# Patient Record
Sex: Female | Born: 1935 | Race: White | Hispanic: No | Smoking: Never smoker
Health system: Southern US, Community
[De-identification: ages and names within clinical notes are randomized; demographics above are authoritative.]

## PROBLEM LIST (undated history)

## (undated) DIAGNOSIS — R251 Tremor, unspecified: Secondary | ICD-10-CM

## (undated) DIAGNOSIS — F0390 Unspecified dementia without behavioral disturbance: Secondary | ICD-10-CM

## (undated) DIAGNOSIS — E079 Disorder of thyroid, unspecified: Secondary | ICD-10-CM

## (undated) HISTORY — DX: Tremor, unspecified: R25.1

## (undated) HISTORY — DX: Unspecified dementia, unspecified severity, without behavioral disturbance, psychotic disturbance, mood disturbance, and anxiety: F03.90

## (undated) HISTORY — DX: Disorder of thyroid, unspecified: E07.9

---

## 1958-06-18 HISTORY — PX: THYROID SURGERY: SHX805

## 2000-08-07 ENCOUNTER — Emergency Department (HOSPITAL_COMMUNITY): Admission: EM | Admit: 2000-08-07 | Discharge: 2000-08-07 | Payer: Self-pay | Admitting: Emergency Medicine

## 2010-11-11 ENCOUNTER — Emergency Department (HOSPITAL_COMMUNITY): Payer: Medicare Other

## 2010-11-11 ENCOUNTER — Inpatient Hospital Stay (HOSPITAL_COMMUNITY): Payer: Medicare Other

## 2010-11-11 ENCOUNTER — Inpatient Hospital Stay (HOSPITAL_COMMUNITY)
Admission: EM | Admit: 2010-11-11 | Discharge: 2010-11-21 | DRG: 808 | Disposition: A | Discharge status: Rehabilitation Facility | Payer: Medicare Other | Attending: Family Medicine | Admitting: Family Medicine

## 2010-11-11 DIAGNOSIS — Z79899 Other long term (current) drug therapy: Secondary | ICD-10-CM

## 2010-11-11 DIAGNOSIS — Z23 Encounter for immunization: Secondary | ICD-10-CM

## 2010-11-11 DIAGNOSIS — D72819 Decreased white blood cell count, unspecified: Secondary | ICD-10-CM

## 2010-11-11 DIAGNOSIS — F341 Dysthymic disorder: Secondary | ICD-10-CM | POA: Diagnosis present

## 2010-11-11 DIAGNOSIS — R6520 Severe sepsis without septic shock: Secondary | ICD-10-CM | POA: Diagnosis not present

## 2010-11-11 DIAGNOSIS — R197 Diarrhea, unspecified: Secondary | ICD-10-CM | POA: Diagnosis present

## 2010-11-11 DIAGNOSIS — E0590 Thyrotoxicosis, unspecified without thyrotoxic crisis or storm: Secondary | ICD-10-CM | POA: Diagnosis present

## 2010-11-11 DIAGNOSIS — Z7982 Long term (current) use of aspirin: Secondary | ICD-10-CM

## 2010-11-11 DIAGNOSIS — D61811 Other drug-induced pancytopenia: Principal | ICD-10-CM | POA: Diagnosis present

## 2010-11-11 DIAGNOSIS — R5381 Other malaise: Secondary | ICD-10-CM | POA: Diagnosis not present

## 2010-11-11 DIAGNOSIS — E43 Unspecified severe protein-calorie malnutrition: Secondary | ICD-10-CM | POA: Diagnosis not present

## 2010-11-11 DIAGNOSIS — D696 Thrombocytopenia, unspecified: Secondary | ICD-10-CM

## 2010-11-11 DIAGNOSIS — E876 Hypokalemia: Secondary | ICD-10-CM | POA: Diagnosis present

## 2010-11-11 DIAGNOSIS — E860 Dehydration: Secondary | ICD-10-CM | POA: Diagnosis present

## 2010-11-11 DIAGNOSIS — F0390 Unspecified dementia without behavioral disturbance: Secondary | ICD-10-CM | POA: Diagnosis present

## 2010-11-11 DIAGNOSIS — N179 Acute kidney failure, unspecified: Secondary | ICD-10-CM | POA: Diagnosis present

## 2010-11-11 DIAGNOSIS — R6521 Severe sepsis with septic shock: Secondary | ICD-10-CM | POA: Diagnosis not present

## 2010-11-11 DIAGNOSIS — E538 Deficiency of other specified B group vitamins: Secondary | ICD-10-CM | POA: Diagnosis present

## 2010-11-11 DIAGNOSIS — T382X5A Adverse effect of antithyroid drugs, initial encounter: Secondary | ICD-10-CM | POA: Diagnosis present

## 2010-11-11 DIAGNOSIS — I10 Essential (primary) hypertension: Secondary | ICD-10-CM | POA: Diagnosis present

## 2010-11-11 DIAGNOSIS — A419 Sepsis, unspecified organism: Secondary | ICD-10-CM | POA: Diagnosis not present

## 2010-11-11 LAB — DIFFERENTIAL
Basophils Absolute: 0 10*3/uL (ref 0.0–0.1)
Basophils Relative: 1 % (ref 0–1)
Lymphocytes Relative: 30 % (ref 12–46)
Monocytes Absolute: 0.1 10*3/uL (ref 0.1–1.0)
Monocytes Relative: 9 % (ref 3–12)
Neutro Abs: 0.8 10*3/uL — ABNORMAL LOW (ref 1.7–7.7)
Neutrophils Relative %: 60 % (ref 43–77)

## 2010-11-11 LAB — POCT I-STAT, CHEM 8
BUN: 44 mg/dL — ABNORMAL HIGH (ref 6–23)
Creatinine, Ser: 2.4 mg/dL — ABNORMAL HIGH (ref 0.4–1.2)
Potassium: 3.2 mEq/L — ABNORMAL LOW (ref 3.5–5.1)
Sodium: 131 mEq/L — ABNORMAL LOW (ref 135–145)
TCO2: 11 mmol/L (ref 0–100)

## 2010-11-11 LAB — URINALYSIS, ROUTINE W REFLEX MICROSCOPIC
Glucose, UA: NEGATIVE mg/dL
Leukocytes, UA: NEGATIVE
pH: 5.5 (ref 5.0–8.0)

## 2010-11-11 LAB — COMPREHENSIVE METABOLIC PANEL
ALT: 59 U/L — ABNORMAL HIGH (ref 0–35)
AST: 150 U/L — ABNORMAL HIGH (ref 0–37)
Alkaline Phosphatase: 52 U/L (ref 39–117)
CO2: 21 mEq/L (ref 19–32)
Calcium: 7.3 mg/dL — ABNORMAL LOW (ref 8.4–10.5)
Chloride: 99 mEq/L (ref 96–112)
GFR calc Af Amer: 57 mL/min — ABNORMAL LOW (ref >60)
GFR calc non Af Amer: 47 mL/min — ABNORMAL LOW (ref >60)
Potassium: 2.6 mEq/L — CL (ref 3.5–5.1)
Sodium: 135 mEq/L (ref 135–145)
Total Bilirubin: 0.6 mg/dL (ref 0.3–1.2)

## 2010-11-11 LAB — URINE MICROSCOPIC-ADD ON

## 2010-11-11 LAB — TECHNOLOGIST SMEAR REVIEW: Path Review: DECREASED

## 2010-11-11 LAB — CK TOTAL AND CKMB (NOT AT ARMC)
CK, MB: 1.1 ng/mL (ref 0.3–4.0)
Total CK: 721 U/L — ABNORMAL HIGH (ref 7–177)

## 2010-11-11 LAB — CBC
HCT: 37.4 % (ref 36.0–46.0)
Hemoglobin: 13.2 g/dL (ref 12.0–15.0)
RBC: 4.99 MIL/uL (ref 3.87–5.11)

## 2010-11-11 LAB — APTT: aPTT: 34 seconds (ref 24–37)

## 2010-11-12 ENCOUNTER — Inpatient Hospital Stay (HOSPITAL_COMMUNITY): Payer: Medicare Other

## 2010-11-12 DIAGNOSIS — D709 Neutropenia, unspecified: Secondary | ICD-10-CM

## 2010-11-12 DIAGNOSIS — D61818 Other pancytopenia: Secondary | ICD-10-CM

## 2010-11-12 LAB — DIFFERENTIAL
Basophils Relative: 0 % (ref 0–1)
Eosinophils Relative: 0 % (ref 0–5)
Lymphocytes Relative: 33 % (ref 12–46)
Lymphocytes Relative: 25 % (ref 12–46)
Lymphs Abs: 0.5 10*3/uL — ABNORMAL LOW (ref 0.7–4.0)
Lymphs Abs: 0.4 10*3/uL — ABNORMAL LOW (ref 0.7–4.0)
Monocytes Absolute: 0.1 10*3/uL (ref 0.1–1.0)
Monocytes Absolute: 0 10*3/uL — ABNORMAL LOW (ref 0.1–1.0)
Monocytes Relative: 4 % (ref 3–12)
Monocytes Relative: 2 % — ABNORMAL LOW (ref 3–12)
Neutro Abs: 1.2 10*3/uL — ABNORMAL LOW (ref 1.7–7.7)
Neutrophils Relative %: 73 % (ref 43–77)

## 2010-11-12 LAB — COMPREHENSIVE METABOLIC PANEL
ALT: 54 U/L — ABNORMAL HIGH (ref 0–35)
ALT: 72 U/L — ABNORMAL HIGH (ref 0–35)
AST: 153 U/L — ABNORMAL HIGH (ref 0–37)
AST: 225 U/L — ABNORMAL HIGH (ref 0–37)
Albumin: 2.1 g/dL — ABNORMAL LOW (ref 3.5–5.2)
CO2: 19 mEq/L (ref 19–32)
CO2: 16 mEq/L — ABNORMAL LOW (ref 19–32)
Calcium: 6.4 mg/dL — CL (ref 8.4–10.5)
Calcium: 7.1 mg/dL — ABNORMAL LOW (ref 8.4–10.5)
Chloride: 109 mEq/L (ref 96–112)
Chloride: 113 mEq/L — ABNORMAL HIGH (ref 96–112)
Creatinine, Ser: 1.05 mg/dL (ref 0.4–1.2)
GFR calc Af Amer: 59 mL/min — ABNORMAL LOW (ref >60)
GFR calc Af Amer: >60 mL/min (ref >60)
GFR calc non Af Amer: 49 mL/min — ABNORMAL LOW (ref >60)
GFR calc non Af Amer: 50 mL/min — ABNORMAL LOW (ref >60)
Glucose, Bld: 126 mg/dL — ABNORMAL HIGH (ref 70–99)
Sodium: 137 mEq/L (ref 135–145)
Total Bilirubin: 0.6 mg/dL (ref 0.3–1.2)

## 2010-11-12 LAB — URINALYSIS, ROUTINE W REFLEX MICROSCOPIC
Glucose, UA: NEGATIVE mg/dL
Protein, ur: 100 mg/dL — AB
pH: 5.5 (ref 5.0–8.0)

## 2010-11-12 LAB — URINE MICROSCOPIC-ADD ON

## 2010-11-12 LAB — BLOOD GAS, ARTERIAL
Acid-base deficit: 6 mmol/L — ABNORMAL HIGH (ref 0.0–2.0)
Drawn by: 31101
O2 Content: 2 L/min
O2 Saturation: 97.6 %
Patient temperature: 98.6
pO2, Arterial: 96 mmHg (ref 80.0–100.0)

## 2010-11-12 LAB — FERRITIN: Ferritin: 6187 ng/mL — ABNORMAL HIGH (ref 10–291)

## 2010-11-12 LAB — PROTIME-INR
INR: 1.23 (ref 0.00–1.49)
Prothrombin Time: 15.7 seconds — ABNORMAL HIGH (ref 11.6–15.2)

## 2010-11-12 LAB — CBC
HCT: 27 % — ABNORMAL LOW (ref 36.0–46.0)
HCT: 28.9 % — ABNORMAL LOW (ref 36.0–46.0)
Hemoglobin: 9.9 g/dL — ABNORMAL LOW (ref 12.0–15.0)
MCH: 26 pg (ref 26.0–34.0)
MCHC: 34.3 g/dL (ref 30.0–36.0)
MCHC: 35.6 g/dL (ref 30.0–36.0)
MCV: 75.6 fL — ABNORMAL LOW (ref 78.0–100.0)
RBC: 3.81 MIL/uL — ABNORMAL LOW (ref 3.87–5.11)
RDW: 14.8 % (ref 11.5–15.5)

## 2010-11-12 LAB — TSH: TSH: 0.008 u[IU]/mL — ABNORMAL LOW (ref 0.350–4.500)

## 2010-11-12 LAB — DIC (DISSEMINATED INTRAVASCULAR COAGULATION)PANEL
D-Dimer, Quant: 12.02 ug/mL-FEU — ABNORMAL HIGH (ref 0.00–0.48)
Prothrombin Time: 15.7 seconds — ABNORMAL HIGH (ref 11.6–15.2)
Smear Review: NONE SEEN

## 2010-11-12 LAB — T3: T3, Total: 90.3 ng/dl (ref 80.0–204.0)

## 2010-11-12 LAB — CLOSTRIDIUM DIFFICILE BY PCR: Toxigenic C. Difficile by PCR: NEGATIVE

## 2010-11-12 LAB — VITAMIN B12: Vitamin B-12: >2000 pg/mL — ABNORMAL HIGH (ref 211–911)

## 2010-11-12 LAB — D-DIMER, QUANTITATIVE: D-Dimer, Quant: 12.02 ug/mL-FEU — ABNORMAL HIGH (ref 0.00–0.48)

## 2010-11-12 LAB — FOLATE: Folate: >20 ng/mL

## 2010-11-12 LAB — IRON AND TIBC: TIBC: 174 ug/dL — ABNORMAL LOW (ref 250–470)

## 2010-11-12 LAB — CORTISOL: Cortisol, Plasma: 18.8 ug/dL

## 2010-11-12 LAB — LACTIC ACID, PLASMA: Lactic Acid, Venous: 2.1 mmol/L (ref 0.5–2.2)

## 2010-11-12 LAB — CARDIAC PANEL(CRET KIN+CKTOT+MB+TROPI): Relative Index: 0.1 (ref 0.0–2.5)

## 2010-11-12 LAB — URINE CULTURE

## 2010-11-12 LAB — T4, FREE: Free T4: 1.89 ng/dL — ABNORMAL HIGH (ref 0.80–1.80)

## 2010-11-13 ENCOUNTER — Inpatient Hospital Stay (HOSPITAL_COMMUNITY): Payer: Medicare Other

## 2010-11-13 DIAGNOSIS — D6959 Other secondary thrombocytopenia: Secondary | ICD-10-CM

## 2010-11-13 DIAGNOSIS — D72819 Decreased white blood cell count, unspecified: Secondary | ICD-10-CM

## 2010-11-13 DIAGNOSIS — R6521 Severe sepsis with septic shock: Secondary | ICD-10-CM

## 2010-11-13 DIAGNOSIS — D684 Acquired coagulation factor deficiency: Secondary | ICD-10-CM

## 2010-11-13 DIAGNOSIS — A419 Sepsis, unspecified organism: Secondary | ICD-10-CM

## 2010-11-13 LAB — BLOOD GAS, ARTERIAL
O2 Content: 2 L/min
O2 Saturation: 98.5 %
Patient temperature: 97.6
pH, Arterial: 7.556 — ABNORMAL HIGH (ref 7.350–7.400)

## 2010-11-13 LAB — BASIC METABOLIC PANEL
BUN: 20 mg/dL (ref 6–23)
CO2: 26 mEq/L (ref 19–32)
CO2: 28 mEq/L (ref 19–32)
Calcium: 6.4 mg/dL — CL (ref 8.4–10.5)
Chloride: 97 mEq/L (ref 96–112)
Creatinine, Ser: 0.79 mg/dL (ref 0.4–1.2)
GFR calc Af Amer: >60 mL/min (ref >60)
GFR calc non Af Amer: >60 mL/min (ref >60)
GFR calc non Af Amer: >60 mL/min (ref >60)
Glucose, Bld: 172 mg/dL — ABNORMAL HIGH (ref 70–99)
Glucose, Bld: 148 mg/dL — ABNORMAL HIGH (ref 70–99)
Potassium: 3 mEq/L — ABNORMAL LOW (ref 3.5–5.1)
Sodium: 136 mEq/L (ref 135–145)
Sodium: 136 mEq/L (ref 135–145)

## 2010-11-14 ENCOUNTER — Inpatient Hospital Stay (HOSPITAL_COMMUNITY): Payer: Medicare Other

## 2010-11-14 DIAGNOSIS — N179 Acute kidney failure, unspecified: Secondary | ICD-10-CM

## 2010-11-14 DIAGNOSIS — R6520 Severe sepsis without septic shock: Secondary | ICD-10-CM

## 2010-11-14 DIAGNOSIS — A419 Sepsis, unspecified organism: Secondary | ICD-10-CM

## 2010-11-14 DIAGNOSIS — R6521 Severe sepsis with septic shock: Secondary | ICD-10-CM

## 2010-11-14 LAB — GIARDIA/CRYPTOSPORIDIUM SCREEN(EIA): Cryptosporidium Screen (EIA): NEGATIVE

## 2010-11-14 LAB — CBC
HCT: 27.8 % — ABNORMAL LOW (ref 36.0–46.0)
HCT: 28.9 % — ABNORMAL LOW (ref 36.0–46.0)
Hemoglobin: 10 g/dL — ABNORMAL LOW (ref 12.0–15.0)
MCH: 26.7 pg (ref 26.0–34.0)
MCH: 26.2 pg (ref 26.0–34.0)
MCHC: 36 g/dL (ref 30.0–36.0)
MCHC: 34.6 g/dL (ref 30.0–36.0)
MCV: 75.7 fL — ABNORMAL LOW (ref 78.0–100.0)
RBC: 3.82 MIL/uL — ABNORMAL LOW (ref 3.87–5.11)
RDW: 15.5 % (ref 11.5–15.5)

## 2010-11-14 LAB — DIC (DISSEMINATED INTRAVASCULAR COAGULATION)PANEL
D-Dimer, Quant: 17.83 ug/mL-FEU — ABNORMAL HIGH (ref 0.00–0.48)
Fibrinogen: 67 mg/dL — CL (ref 204–475)
Platelets: 25 10*3/uL — CL (ref 150–400)
aPTT: 34 seconds (ref 24–37)

## 2010-11-14 LAB — COMPREHENSIVE METABOLIC PANEL
AST: 193 U/L — ABNORMAL HIGH (ref 0–37)
CO2: 29 mEq/L (ref 19–32)
Calcium: 6.3 mg/dL — CL (ref 8.4–10.5)
Creatinine, Ser: 0.79 mg/dL (ref 0.4–1.2)
GFR calc Af Amer: >60 mL/min (ref >60)
GFR calc non Af Amer: >60 mL/min (ref >60)
Total Protein: 4.1 g/dL — ABNORMAL LOW (ref 6.0–8.3)

## 2010-11-15 DIAGNOSIS — D684 Acquired coagulation factor deficiency: Secondary | ICD-10-CM

## 2010-11-15 DIAGNOSIS — R6521 Severe sepsis with septic shock: Secondary | ICD-10-CM

## 2010-11-15 DIAGNOSIS — A419 Sepsis, unspecified organism: Secondary | ICD-10-CM

## 2010-11-15 DIAGNOSIS — D6959 Other secondary thrombocytopenia: Secondary | ICD-10-CM

## 2010-11-15 DIAGNOSIS — D709 Neutropenia, unspecified: Secondary | ICD-10-CM

## 2010-11-15 LAB — CBC
HCT: 27.6 % — ABNORMAL LOW (ref 36.0–46.0)
MCH: 25.9 pg — ABNORMAL LOW (ref 26.0–34.0)
MCV: 76 fL — ABNORMAL LOW (ref 78.0–100.0)
Platelets: 27 10*3/uL — CL (ref 150–400)
RBC: 3.63 MIL/uL — ABNORMAL LOW (ref 3.87–5.11)
RDW: 15.8 % — ABNORMAL HIGH (ref 11.5–15.5)

## 2010-11-15 LAB — PREPARE CRYOPRECIPITATE: Unit division: 0

## 2010-11-15 LAB — BASIC METABOLIC PANEL
CO2: 25 mEq/L (ref 19–32)
Chloride: 101 mEq/L (ref 96–112)
Creatinine, Ser: 0.78 mg/dL (ref 0.4–1.2)
GFR calc Af Amer: >60 mL/min (ref >60)
Potassium: 3.8 mEq/L (ref 3.5–5.1)

## 2010-11-15 LAB — EHEC TOXIN BY EIA, STOOL: EHEC Toxin by EIA: NEGATIVE

## 2010-11-16 LAB — COMPREHENSIVE METABOLIC PANEL
ALT: 75 U/L — ABNORMAL HIGH (ref 0–35)
AST: 115 U/L — ABNORMAL HIGH (ref 0–37)
Albumin: 1.6 g/dL — ABNORMAL LOW (ref 3.5–5.2)
Alkaline Phosphatase: 68 U/L (ref 39–117)
Calcium: 5.6 mg/dL — CL (ref 8.4–10.5)
GFR calc Af Amer: >60 mL/min (ref >60)
Potassium: 3.8 mEq/L (ref 3.5–5.1)
Sodium: 137 mEq/L (ref 135–145)
Total Protein: 3.8 g/dL — ABNORMAL LOW (ref 6.0–8.3)

## 2010-11-16 LAB — CBC
HCT: 25.8 % — ABNORMAL LOW (ref 36.0–46.0)
Platelets: 26 10*3/uL — CL (ref 150–400)
RDW: 16.1 % — ABNORMAL HIGH (ref 11.5–15.5)
WBC: 4.3 10*3/uL (ref 4.0–10.5)

## 2010-11-16 LAB — GLUCOSE, CAPILLARY: Glucose-Capillary: 140 mg/dL — ABNORMAL HIGH (ref 70–99)

## 2010-11-17 ENCOUNTER — Inpatient Hospital Stay (HOSPITAL_COMMUNITY): Payer: Medicare Other

## 2010-11-17 LAB — CBC
HCT: 25.3 % — ABNORMAL LOW (ref 36.0–46.0)
Hemoglobin: 8.8 g/dL — ABNORMAL LOW (ref 12.0–15.0)
MCHC: 34.8 g/dL (ref 30.0–36.0)
MCV: 75.7 fL — ABNORMAL LOW (ref 78.0–100.0)
RDW: 15.9 % — ABNORMAL HIGH (ref 11.5–15.5)

## 2010-11-17 LAB — DIFFERENTIAL
Eosinophils Relative: 0 % (ref 0–5)
Lymphocytes Relative: 53 % — ABNORMAL HIGH (ref 12–46)
Lymphs Abs: 2.6 10*3/uL (ref 0.7–4.0)
Monocytes Relative: 7 % (ref 3–12)
Neutro Abs: 1.9 10*3/uL (ref 1.7–7.7)

## 2010-11-17 LAB — BASIC METABOLIC PANEL
BUN: 23 mg/dL (ref 6–23)
CO2: 25 mEq/L (ref 19–32)
CO2: 25 mEq/L (ref 19–32)
Calcium: 5.6 mg/dL — CL (ref 8.4–10.5)
Chloride: 106 mEq/L (ref 96–112)
Creatinine, Ser: 0.54 mg/dL (ref 0.4–1.2)
GFR calc Af Amer: >60 mL/min (ref >60)
GFR calc non Af Amer: >60 mL/min (ref >60)
Glucose, Bld: 131 mg/dL — ABNORMAL HIGH (ref 70–99)
Glucose, Bld: 159 mg/dL — ABNORMAL HIGH (ref 70–99)
Potassium: 3.3 mEq/L — ABNORMAL LOW (ref 3.5–5.1)
Sodium: 137 mEq/L (ref 135–145)
Sodium: 140 mEq/L (ref 135–145)

## 2010-11-18 LAB — PREPARE CRYOPRECIPITATE
Unit division: 0
Unit division: 0
Unit division: 0
Unit division: 0
Unit division: 0
Unit division: 0
Unit division: 0

## 2010-11-18 LAB — CBC
MCH: 25.6 pg — ABNORMAL LOW (ref 26.0–34.0)
MCV: 75.7 fL — ABNORMAL LOW (ref 78.0–100.0)
Platelets: 62 10*3/uL — ABNORMAL LOW (ref 150–400)
RDW: 15.9 % — ABNORMAL HIGH (ref 11.5–15.5)
WBC: 4.1 10*3/uL (ref 4.0–10.5)

## 2010-11-18 LAB — CULTURE, BLOOD (ROUTINE X 2)
Culture  Setup Time: 201205270049
Culture  Setup Time: 201205271122
Culture: NO GROWTH
Culture: NO GROWTH
Culture: NO GROWTH

## 2010-11-18 LAB — COMPREHENSIVE METABOLIC PANEL
Albumin: 1.8 g/dL — ABNORMAL LOW (ref 3.5–5.2)
BUN: 18 mg/dL (ref 6–23)
Creatinine, Ser: 0.47 mg/dL (ref 0.4–1.2)
GFR calc Af Amer: >60 mL/min (ref >60)
Potassium: 2.9 mEq/L — ABNORMAL LOW (ref 3.5–5.1)
Total Protein: 4.1 g/dL — ABNORMAL LOW (ref 6.0–8.3)

## 2010-11-19 LAB — CBC
HCT: 27.1 % — ABNORMAL LOW (ref 36.0–46.0)
HCT: 28.7 % — ABNORMAL LOW (ref 36.0–46.0)
MCH: 25.8 pg — ABNORMAL LOW (ref 26.0–34.0)
MCHC: 33.8 g/dL (ref 30.0–36.0)
MCV: 75.7 fL — ABNORMAL LOW (ref 78.0–100.0)
MCV: 76.3 fL — ABNORMAL LOW (ref 78.0–100.0)
RBC: 3.58 MIL/uL — ABNORMAL LOW (ref 3.87–5.11)
RDW: 16 % — ABNORMAL HIGH (ref 11.5–15.5)
WBC: 4.4 10*3/uL (ref 4.0–10.5)
WBC: 5.6 10*3/uL (ref 4.0–10.5)

## 2010-11-19 LAB — BASIC METABOLIC PANEL
BUN: 12 mg/dL (ref 6–23)
CO2: 35 mEq/L — ABNORMAL HIGH (ref 19–32)
Chloride: 101 mEq/L (ref 96–112)
GFR calc Af Amer: >60 mL/min (ref >60)
GFR calc non Af Amer: >60 mL/min (ref >60)
Glucose, Bld: 125 mg/dL — ABNORMAL HIGH (ref 70–99)
Glucose, Bld: 165 mg/dL — ABNORMAL HIGH (ref 70–99)
Potassium: <2 mEq/L — CL (ref 3.5–5.1)
Potassium: 2.5 mEq/L — CL (ref 3.5–5.1)
Sodium: 140 mEq/L (ref 135–145)

## 2010-11-19 LAB — CLOSTRIDIUM DIFFICILE BY PCR: Toxigenic C. Difficile by PCR: NEGATIVE

## 2010-11-20 DIAGNOSIS — R5381 Other malaise: Secondary | ICD-10-CM

## 2010-11-20 LAB — GIARDIA/CRYPTOSPORIDIUM SCREEN(EIA): Cryptosporidium Screen (EIA): NEGATIVE

## 2010-11-20 LAB — RENAL FUNCTION PANEL
Albumin: 2.1 g/dL — ABNORMAL LOW (ref 3.5–5.2)
CO2: 38 mEq/L — ABNORMAL HIGH (ref 19–32)
Chloride: 100 mEq/L (ref 96–112)
Potassium: 2.8 mEq/L — ABNORMAL LOW (ref 3.5–5.1)
Sodium: 143 mEq/L (ref 135–145)

## 2010-11-20 LAB — CBC
Hemoglobin: 9.1 g/dL — ABNORMAL LOW (ref 12.0–15.0)
Platelets: 120 10*3/uL — ABNORMAL LOW (ref 150–400)
RBC: 3.52 MIL/uL — ABNORMAL LOW (ref 3.87–5.11)
WBC: 4.4 10*3/uL (ref 4.0–10.5)

## 2010-11-20 LAB — MAGNESIUM: Magnesium: 1.4 mg/dL — ABNORMAL LOW (ref 1.5–2.5)

## 2010-11-21 ENCOUNTER — Inpatient Hospital Stay (HOSPITAL_COMMUNITY)
Admission: RE | Admit: 2010-11-21 | Discharge: 2010-12-05 | DRG: 945 | Disposition: A | Discharge status: Home Health | Payer: Medicare Other | Source: Other Acute Inpatient Hospital | Attending: Physical Medicine & Rehabilitation | Admitting: Physical Medicine & Rehabilitation

## 2010-11-21 DIAGNOSIS — E876 Hypokalemia: Secondary | ICD-10-CM

## 2010-11-21 DIAGNOSIS — E538 Deficiency of other specified B group vitamins: Secondary | ICD-10-CM

## 2010-11-21 DIAGNOSIS — R5381 Other malaise: Secondary | ICD-10-CM

## 2010-11-21 DIAGNOSIS — R1310 Dysphagia, unspecified: Secondary | ICD-10-CM

## 2010-11-21 DIAGNOSIS — Z5189 Encounter for other specified aftercare: Secondary | ICD-10-CM

## 2010-11-21 DIAGNOSIS — F0280 Dementia in other diseases classified elsewhere without behavioral disturbance: Secondary | ICD-10-CM

## 2010-11-21 DIAGNOSIS — D61811 Other drug-induced pancytopenia: Secondary | ICD-10-CM

## 2010-11-21 DIAGNOSIS — A419 Sepsis, unspecified organism: Secondary | ICD-10-CM

## 2010-11-21 DIAGNOSIS — I10 Essential (primary) hypertension: Secondary | ICD-10-CM

## 2010-11-21 DIAGNOSIS — E0590 Thyrotoxicosis, unspecified without thyrotoxic crisis or storm: Secondary | ICD-10-CM

## 2010-11-21 DIAGNOSIS — B952 Enterococcus as the cause of diseases classified elsewhere: Secondary | ICD-10-CM

## 2010-11-21 DIAGNOSIS — Z79899 Other long term (current) drug therapy: Secondary | ICD-10-CM

## 2010-11-21 DIAGNOSIS — Z7982 Long term (current) use of aspirin: Secondary | ICD-10-CM

## 2010-11-21 DIAGNOSIS — T382X5A Adverse effect of antithyroid drugs, initial encounter: Secondary | ICD-10-CM

## 2010-11-21 DIAGNOSIS — N390 Urinary tract infection, site not specified: Secondary | ICD-10-CM

## 2010-11-21 DIAGNOSIS — R197 Diarrhea, unspecified: Secondary | ICD-10-CM

## 2010-11-21 LAB — T3, FREE: T3, Free: 3.7 pg/mL (ref 2.3–4.2)

## 2010-11-21 LAB — FECAL LACTOFERRIN, QUANT

## 2010-11-21 LAB — GLUCOSE, CAPILLARY
Glucose-Capillary: 111 mg/dL — ABNORMAL HIGH (ref 70–99)
Glucose-Capillary: 92 mg/dL (ref 70–99)

## 2010-11-21 LAB — BASIC METABOLIC PANEL
BUN: 12 mg/dL (ref 6–23)
CO2: 37 mEq/L — ABNORMAL HIGH (ref 19–32)
Chloride: 100 mEq/L (ref 96–112)
Creatinine, Ser: <0.47 mg/dL (ref 0.4–1.2)
Potassium: 2.2 mEq/L — CL (ref 3.5–5.1)

## 2010-11-21 LAB — MAGNESIUM: Magnesium: 1.6 mg/dL (ref 1.5–2.5)

## 2010-11-21 NOTE — Group Therapy Note (Signed)
Brittney Yang, Brittney Yang NO.:  [id number]  MEDICAL RECORD NO.:  [id number]  LOCATION:  6738                         FACILITY:  [facility]  PHYSICIAN:  Tarry Kos, MD       DATE OF BIRTH:  08-Jul-1927                                PROGRESS NOTE   DIAGNOSES: 1. Pancytopenia. 2. Generalized weakness. 3. Nausea, vomiting, diarrhea. 4. Above issues are thought to be secondary to methimazole. 5. History of hyperthyroidism as an outpatient recently started on     methimazole. 6. Anxiety. 7. Mild dementia. 8. Acute renal failure. 9. Severe malnourishment. 10.Sepsis with unclear source. 11.Severe deconditioning. 12.Hypokalemia. 13.Hypomagnesemia. 14.Hypocalcemia.  SUMMARY OF HOSPITAL COURSE: 1. Brittney Yang is an 77 year old female who presented to the emergency     department after having some abnormal labs drawn from her primary     care physician's office with pancytopenia that was thought to be     due to recent methimazole medication.  She was admitted, placed on     neutropenic precautions but did run a fever while she was here.     She was placed on very broad-spectrum antibiotics, but all of her     cultures have been negative.  Today, she is currently still on     [place] and that today would be day 10 of [place], I am going to stop     her [place] today.  She has been afebrile for over 3 days.  Her     neutropenia has resolved along with normalization of her platelet     count.  She did have multiple cultures of her stool because she had     significant amount of diarrhea and those have been negative.  Her     diarrhea did seem to improve over the first several days of     hospitalization but then over the last 48 hours she has had     increase in her diarrhea so I have recultured her and rechecked the     C diff which have all been negative.  I think this is probably due     to the excessive antibiotic use, which is another reason I am     stopping her  antibiotics today.  Again, all of her cultures have     been negative.  Her methimazole obviously had also been stopped.     Her thyroid studies during this hospitalization showed a TSH of     0.008 with a T3 of 90 and a T4 of 1.89.  She will not go likely     need repeat thyroid studies within the next week. 2. Acute renal failure.  She did have a renal failure on admission due     to her dehydration.  This is resolved with IV fluids.  Her BUN and     creatinine have normalized to 10 and 0.47. 3. Electrolyte abnormalities.  Her potassium level has gotten worse     over the last several days due to diarrhea.  This has been having     to be replaced.  Her calcium level is  also low.  However, when  it     is corrected, her calcium level is around 8, however, her albumin     level has been around 1.8-2.  Nutritional consult has been obtained     and we are trying to supplement her diet with Ensure. 4. Sepsis with unclear source as above.  Again, I am stopping her     antibiotics.  She has completed 10 days of [place]. 5. Anxiety, depression.  The patient has been on Librium for many,     many years.  This has been continued while she is here.  Depending     on how she does in rehabilitation, I would consider in the next 1-2     weeks starting her on antidepressant if needed as her mood has been     down since she has been in the hospital. 6. Nausea, vomiting.  This has resolved. 7. She has a large amount of family support.  The family has agreed     for short-term rehab whether or not that would be in our rehab     facility here in the hospital or in a nursing home.  I have     strongly encouraged them to try rehab first at least 2-4 weeks as     this would be her biggest chance for recovery as I suspect when she     goes home  she will continue to deteriorate.  She is basically full     care.  She can get up into the chair but she is very deconditioned     after the severity of her illness  that she has had this     hospitalization.  Continue to closely monitor her electrolytes and     replace as needed.  She is also on oral supplementation for her     calcium, magnesium and potassium.  We will also continue to monitor     her diarrhea.  Again her C. diff has been repeated and is negative.     Stool cultures are pending and I have started Imodium to help with     that.  Again, I am assuming that the worsening of her diarrhea is     probably due to the antibiotics she has been on side effect.  DISPOSITION: Pending when she gets rehabilitation at short term bed with ultimate plan for her to go home and care for children.  She has also been on steroids for possible adrenal insufficiency.  I will stop those also today.          ______________________________ Tarry Kos, MD     RD/MEDQ  D:  11/20/2010  T:  11/21/2010  Job:  [id number]  Electronically Signed by Tarry Kos MD on 11/21/2010 12:11:55 PM

## 2010-11-22 ENCOUNTER — Inpatient Hospital Stay (HOSPITAL_COMMUNITY): Payer: Medicare Other

## 2010-11-22 DIAGNOSIS — R1310 Dysphagia, unspecified: Secondary | ICD-10-CM

## 2010-11-22 DIAGNOSIS — I10 Essential (primary) hypertension: Secondary | ICD-10-CM

## 2010-11-22 DIAGNOSIS — E876 Hypokalemia: Secondary | ICD-10-CM

## 2010-11-22 DIAGNOSIS — R5381 Other malaise: Secondary | ICD-10-CM

## 2010-11-22 DIAGNOSIS — Z5189 Encounter for other specified aftercare: Secondary | ICD-10-CM

## 2010-11-22 LAB — BASIC METABOLIC PANEL
Chloride: 100 mEq/L (ref 96–112)
Potassium: 3.8 mEq/L (ref 3.5–5.1)

## 2010-11-22 LAB — COMPREHENSIVE METABOLIC PANEL
BUN: 11 mg/dL (ref 6–23)
Calcium: 5.7 mg/dL — CL (ref 8.4–10.5)
Glucose, Bld: 88 mg/dL (ref 70–99)
Sodium: 143 mEq/L (ref 135–145)
Total Protein: 4.9 g/dL — ABNORMAL LOW (ref 6.0–8.3)

## 2010-11-22 LAB — DIFFERENTIAL
Basophils Absolute: 0 10*3/uL (ref 0.0–0.1)
Eosinophils Absolute: 0.1 10*3/uL (ref 0.0–0.7)
Eosinophils Relative: 1 % (ref 0–5)
Lymphocytes Relative: 32 % (ref 12–46)
Monocytes Absolute: 0.3 10*3/uL (ref 0.1–1.0)

## 2010-11-22 LAB — CBC
HCT: 28.1 % — ABNORMAL LOW (ref 36.0–46.0)
MCHC: 33.1 g/dL (ref 30.0–36.0)
MCV: 79.4 fL (ref 78.0–100.0)
RDW: 17.2 % — ABNORMAL HIGH (ref 11.5–15.5)

## 2010-11-22 LAB — PREALBUMIN: Prealbumin: 22 mg/dL (ref 17.0–34.0)

## 2010-11-23 ENCOUNTER — Inpatient Hospital Stay (HOSPITAL_COMMUNITY): Payer: Medicare Other

## 2010-11-23 LAB — URINALYSIS, ROUTINE W REFLEX MICROSCOPIC
Glucose, UA: NEGATIVE mg/dL
Protein, ur: 30 mg/dL — AB
Specific Gravity, Urine: 1.01 (ref 1.005–1.030)
pH: 8.5 — ABNORMAL HIGH (ref 5.0–8.0)

## 2010-11-23 LAB — URINE MICROSCOPIC-ADD ON

## 2010-11-23 LAB — BASIC METABOLIC PANEL
BUN: 10 mg/dL (ref 6–23)
Chloride: 103 mEq/L (ref 96–112)
Creatinine, Ser: 0.48 mg/dL (ref 0.4–1.2)

## 2010-11-23 LAB — STOOL CULTURE

## 2010-11-24 ENCOUNTER — Encounter (HOSPITAL_COMMUNITY): Payer: BLUE CROSS/BLUE SHIELD

## 2010-11-24 ENCOUNTER — Other Ambulatory Visit (HOSPITAL_COMMUNITY): Payer: BLUE CROSS/BLUE SHIELD

## 2010-11-24 DIAGNOSIS — I10 Essential (primary) hypertension: Secondary | ICD-10-CM

## 2010-11-24 DIAGNOSIS — E876 Hypokalemia: Secondary | ICD-10-CM

## 2010-11-24 DIAGNOSIS — Z5189 Encounter for other specified aftercare: Secondary | ICD-10-CM

## 2010-11-24 DIAGNOSIS — R5381 Other malaise: Secondary | ICD-10-CM

## 2010-11-24 DIAGNOSIS — R1310 Dysphagia, unspecified: Secondary | ICD-10-CM

## 2010-11-24 LAB — CLOSTRIDIUM DIFFICILE BY PCR: Toxigenic C. Difficile by PCR: NEGATIVE

## 2010-11-25 LAB — URINE CULTURE

## 2010-11-26 NOTE — Consult Note (Signed)
Brittney Yang, Brittney Yang NO.:  [id number]  MEDICAL RECORD NO.:  [id number]           PATIENT TYPE:  LOCATION:                                 FACILITY:  PHYSICIAN:  Lowella Dell, M.D.DATE OF BIRTH:  July 10, 1927  DATE OF CONSULTATION: DATE OF DISCHARGE:                                CONSULTATION   Brittney Yang is an 77 year old [place] woman referred by the Hospitalist Service for evaluation of pancytopenia.  The patient does not have a prior hematologic history.  However, she has a history of hyperthyroidism which has been treated since mid April with methimazole at 5 mg daily.  The patient has tolerated the medication well and has been followed through Dr. Tawana Scale office, however, 2 days ago the patient presented to him with a new complaint of nausea, vomiting, and diarrhea.  These were treated symptomatically, but labs were obtained showing significant cytopenia, so the patient was referred to the emergency room here for further evaluation.  Here today, the patient's white cell count was 1.3 with an absolute neutrophil count of 800, the hemoglobin was 13.2 with an MCV of 74.9 and the platelet count was 32,000.  With this information, the patient is referred for further evaluation and treatment.  PAST MEDICAL HISTORY:  Significant for mild dementia (most of this history is obtained through her daughter, Brittney Yang present today). There is a history of hypertension, anxiety, and B12 deficiency.  The patient has had no surgeries in the past.  FAMILY HISTORY:  The patient's father died from myocardial infarction in his 24s.  The patient's mother died from a drowning accident in her 12s. The patient has one sister and four brothers.  There is no history of breast cancer or hematologic problems in the family to her knowledge.  GYNECOLOGIC HISTORY:  She is Gx, P2, first pregnancy to term age 15. She went through the change of life in her 38s.  As  far as the patient recalls, she never took hormone replacement.  SOCIAL HISTORY:  The patient is a widow, her husband dying in 47 from myocardial infarction.  She lives by herself, is entirely independent, and drives herself to church and to the grocery store.  Her daughter, Brittney Yang present today as a Geophysicist/field seismologist and son, Brittney Yang who lives nearby is disabled secondary to gout and arthritis.  The patient has one grandchild, a second grandchild unfortunately died in an automobile accident.  The patient attends a [employer].  HEALTH MAINTENANCE:  She does not recall having a colonoscopy.  She did have a bone density at some point and she tells me she took Fosamax for several years on that basis.  There is no history of tobacco or EtOH abuse.  She does not know her cholesterol and she cannot recall when she last had a mammogram which apparently was obtained in [facility] (we have no records of mammograms here).  ALLERGIES:  The patient is allergic to PENICILLIN.  MEDICATIONS:  Current medications include Aricept, Flagyl, potassium, ondansetron, and Librium.  REVIEW OF SYSTEMS:  The patient is unable to tell me much except  that she feels "okay" and that she is ready to go home.  The daughter, however, tells me that she has been in excellent shape for her age as noted in the social history until 2 days ago.  She feels that she is much more confused now that is her baseline.  Normally, she recognizes people well and can give a good account of her daytime activities for example.  Otherwise, a detailed review of systems today was noncontributory.  In particular, the patient denies any bleeding or fever.  PHYSICAL EXAMINATION:  HEENT:  The oropharynx is dry.  There is no thrush.  I do not palpate any peripheral adenopathy. LUNGS:  No crackles or wheezes. HEART:  Regular rate and rhythm.  I did not appreciate a murmur. BREAST:  No suspicious masses in either breast.  No skin  changes.  No nipple retraction. ABDOMEN:  Soft, nontender.  No organomegaly.  Positive bowel sounds. MUSCULOSKELETAL:  No focal spinal tenderness. NEUROLOGIC:  Nonfocal.  LABORATORY DATA:  In addition to the labs already noted, the patient had a normal coagulation panel, a creatinine of 1.11, BUN 33, and a total bilirubin of 0.6, alkaline phosphatase 52, AST 150, and ALT 59.  The albumin was 2.9 with a total protein of 6.0, calcium was 7.3. Phosphorus was 2.6, magnesium 2.3.  LDH was elevated at 508 and lactic acid was 3.  FILMS:  Chest x-ray obtained today showed no acute disease.  IMPRESSION AND PLAN:  An 77 year old [place] woman with a new diagnosis of leukopenia/neutropenia and moderate to severe thrombocytopenia in the setting of methimazole use.  Review of the blood film shows no platelet clusters, no suggestion of artifactual thrombocytopenia, and no white cell immaturity.  I do not see any schistocytes, micro-ovalocytes, or spherocytes.  There is no polychromasia.  The most likely cause for the patient's cytopenia as well as for her diarrhea and moderately elevated liver function tests is the methimazole.  This medication has been stopped.  I would expect without correction with supportive care only, her counts will recover over the next 3-14 days.  The patient is currently not febrile, but if she does develop fever, she will require antibiotics as per febrile neutropenia.  If her platelet counts were to drop below 10,000 or if bleeding develops, I would consider a platelet transfusion, otherwise it is not indicated at this point.  The patient may have a history of B12 deficiency and she has a low MCV. I am sending an anemia panel to check a B12 and ferritin.  In short, all that the patient needs at present is supportive care as you are providing.  We will follow with you to resolution of her cytopenia.     Lowella Dell, M.D.     GCM/MEDQ  D:   11/11/2010  T:  11/12/2010  Job:  [id number]  cc:   [facility]. Andrey Campanile, M.D.  Electronically Signed by Ruthann Cancer M.D. on 11/26/2010 11:45:48 AM

## 2010-11-27 LAB — BASIC METABOLIC PANEL
Calcium: 6.5 mg/dL — ABNORMAL LOW (ref 8.4–10.5)
Chloride: 106 mEq/L (ref 96–112)
Creatinine, Ser: 0.5 mg/dL (ref 0.4–1.2)
GFR calc Af Amer: >60 mL/min (ref >60)
GFR calc non Af Amer: >60 mL/min (ref >60)

## 2010-11-27 LAB — PRO B NATRIURETIC PEPTIDE: Pro B Natriuretic peptide (BNP): 547.5 pg/mL — ABNORMAL HIGH (ref 0–450)

## 2010-11-28 DIAGNOSIS — E876 Hypokalemia: Secondary | ICD-10-CM

## 2010-11-28 DIAGNOSIS — I10 Essential (primary) hypertension: Secondary | ICD-10-CM

## 2010-11-28 DIAGNOSIS — Z5189 Encounter for other specified aftercare: Secondary | ICD-10-CM

## 2010-11-28 DIAGNOSIS — R5381 Other malaise: Secondary | ICD-10-CM

## 2010-11-28 DIAGNOSIS — R1310 Dysphagia, unspecified: Secondary | ICD-10-CM

## 2010-11-29 ENCOUNTER — Other Ambulatory Visit (HOSPITAL_COMMUNITY): Payer: BLUE CROSS/BLUE SHIELD

## 2010-11-29 ENCOUNTER — Inpatient Hospital Stay (HOSPITAL_COMMUNITY): Payer: Medicare Other

## 2010-11-29 NOTE — H&P (Signed)
[name], ROA NO.:  [id number]  MEDICAL RECORD NO.:  [id number]           PATIENT TYPE:  I  LOCATION:  4505                         FACILITY:  [facility]  PHYSICIAN:  Ramiro Harvest, MD    DATE OF BIRTH:  Jul 21, 1927  DATE OF ADMISSION:  11/11/2010 DATE OF DISCHARGE:                             HISTORY & PHYSICAL   PRIMARY CARE PHYSICIAN:  Gloriajean Dell. Andrey Campanile, M.D. [facility].  CHIEF COMPLAINT:  Abnormal labs, neutropenia, and generalized weakness.  HISTORY OF PRESENT ILLNESS:  Brittney Yang is an 77 year old Caucasian female with history of early dementia, hypertension, hyperthyroidism on methimazole who presents to the ED with abnormal labs from her PCP's office of neutropenia.  The patient's daughter gave the history and states that the patient's saw her PCP 1 month ago for regular checkup. The patient was subsequently placed on methimazole at that time.  One week prior to admission, the patient started experiencing increased lethargy, somnolence, decreased oral intake, feeling cold, fatigue and generalized weakness.  One day prior to admission, the patient developed emesis and diarrhea and was taken to see her PCP.  Per daughter, PCP had some blood work done and the patient's thyroid medication dose was doubled.  PCP office subsequently called the patient's daughter and stated to present to the ED secondary to neutropenia.  The patient denies any fever, no chills, no abdominal pain, no nausea, no vomiting, no constipation, no dysuria, no other associated symptoms.  The patient was seen in the ED, a CBC which was done did show a white count of 1.3, a platelet count of 32, otherwise was within normal limits.  Urinalysis which was done was negative for nitrites or leukocytes.  i-STAT 8 which was done did have a BUN of 44 and a creatinine of 2.40 with a glucose of 140, potassium of 3.2, and a sodium of 131.  First set of cardiac enzymes which  were drawn were negative.  Chest x-ray which was done was negative.  We were called to admit the patient for further evaluation and management.  ALLERGIES:  PENICILLIN.  PAST MEDICAL HISTORY: 1. History of early dementia. 2. Hypertension. 3. Hyperthyroidism status post what seems like a thyroidectomy in the     1960s per her daughter. 4. History of B12 deficiency. 5. Anxiety.  HOME MEDICATIONS: 1. Aspirin 81 mg p.o. daily. 2. Bisoprolol/hydrochlorothiazide 2.5/6.25 mg p.o. daily. 3. Chlordiazepoxide 5 mg p.o. b.i.d. 4. Donepezil 10 mg p.o. daily. 5. Methimazole 5 mg daily and was supposed to start 10 mg today. 6. B12 tablets orally daily.  FAMILY HISTORY:  Mother deceased at age 64 from an accidental drowning. Father deceased in his 34s from an acute MI.  SOCIAL HISTORY:  The patient is widowed, lives alone.  No tobacco use. No alcohol use.  No IV drug use.  REVIEW OF SYSTEMS:  As per HPI, otherwise negative.  PHYSICAL EXAMINATION:  VITAL SIGNS:  Temperature 98.6, blood pressure 113/32, pulse of 110, respirations 18, satting 95% on room air. GENERAL:  The patient is well-developed, well-nourished female in no acute cardiopulmonary distress. HEENT:  Normocephalic,  atraumatic.  Pupils are equal, round, and reactive to light and accommodation.  Extraocular movements are intact. Oropharynx is clear.  No lesions.  No exudates. NECK:  Supple.  No lymphadenopathy.  Dry mucous membranes. RESPIRATORY:  Lungs are clear to auscultation bilaterally.  No wheezes. No rhonchi.  No crackles. CARDIOVASCULAR:  Tachycardic, regular rhythm. ABDOMEN:  Soft, nontender, nondistended.  Positive bowel sounds. EXTREMITIES:  No clubbing, cyanosis, or edema. NEUROLOGIC:  The patient is alert and oriented x3.  Cranial nerves II through XII are grossly intact with no focal deficits.  ADMISSION LABS:  I-STAT 8 sodium of 131, potassium 3.2, chloride 102, glucose 140, BUN 44, creatinine 2.40.  CBC with  a white count of 1.3, hemoglobin 13.2, hematocrit 37.4, platelet count of 32, ANC of 0.8, absolute lymphocyte count of 0.4.  UA was yellow, cloudy, specific gravity 1.018, pH 5.5, glucose negative, bilirubin negative, ketones negative, blood large, protein 100, urobilinogen 0.2, nitrite negative, leukocytes negative.  Urine microscopy 3-6 red blood cells, amorphous urates and phosphates.  Total CK 721, CK-MB 1.1, relative index 0.2, troponin-I less than 0.30.  Chest x-ray with no acute abnormality.  ASSESSMENT AND PLAN:  Ms. [name] is an 77 year old female with history of hypertension, hyperthyroidism recently started on methimazole presenting to the ED with a 1-week history of generalized weakness, lethargy, somnolence, decreased oral intake with a 1-day history of emesis and diarrhea and found to be neutropenic.  1. Neutropenia/thrombocytopenia, questionable etiology may be     secondary to the patient's recent start of methimazole versus an     infectious etiology.  We will admit the patient, place on     neutropenic precautions.  Check stool for C. dif, PCR, ova and     parasites, enteric pathogens.  Check blood cultures x2.  Check a     lactic acid level.  Chest x-ray is negative.  Check LDH.  Check a     peripheral smear.  Check a haptoglobin and hold the patient's     methimazole for now and we will consult with hematology for further     evaluation and recommendations.  Supportive care. 2. Diarrhea.  Check a stool for C. diff PCR, ova and parasite, and     enteric pathogens as well as E. coli O157:H7.  Placed on IV fluids,     supportive care.  We will treat empirically with IV Flagyl for     presumed C. diff colitis. 3. Acute renal failure likely secondary to a prerenal azotemia     secondary to GI losses and decreased oral intake in the setting of     diuretics.  We will check a fractional excretion of sodium.  Check     a renal ultrasound.  We will place a Foley  catheter.  Hydrate with     IV fluids and follow.  If no improvement, we will consult with     renal. 4. Hypokalemia.  Check a magnesium level and replete. 5. Hyperthyroidism.  Check a TSH.  Check a free T4.  Check a free T3.     Check a total T3 and hold the patient's methimazole. 6. Dehydration.  Hydrate with IV fluids. 7. Tachycardia likely secondary to dehydration, probably secondary to     an infectious diarrhea.  We will check a TSH, check an EKG.     Cardiac enzymes were negative x1.  The patient is asymptomatic with     no chest pain.  Hydrate with IV fluids  and follow. 8. Hypertension.  Hold blood pressure medications secondary to     dehydration. 9. Dementia.  Continue home dose Aricept. 10.Prophylaxis.  SCDs for DVT prophylaxis.  It has been a pleasure taking care of Ms. Aquilla Hacker.     Ramiro Harvest, MD     DT/MEDQ  D:  11/11/2010  T:  11/11/2010  Job:  [id number]  cc:   Gloriajean Dell. Andrey Campanile, M.D.  Electronically Signed by Ramiro Harvest MD on 11/29/2010 02:40:38 PM

## 2010-11-30 LAB — CBC
HCT: 32.3 % — ABNORMAL LOW (ref 36.0–46.0)
Platelets: 163 10*3/uL (ref 150–400)
RDW: 19 % — ABNORMAL HIGH (ref 11.5–15.5)
WBC: 2.8 10*3/uL — ABNORMAL LOW (ref 4.0–10.5)

## 2010-11-30 LAB — BASIC METABOLIC PANEL
Chloride: 103 mEq/L (ref 96–112)
GFR calc Af Amer: >60 mL/min (ref >60)
Potassium: 4.2 mEq/L (ref 3.5–5.1)
Sodium: 139 mEq/L (ref 135–145)

## 2010-11-30 LAB — CLOSTRIDIUM DIFFICILE BY PCR: Toxigenic C. Difficile by PCR: NEGATIVE

## 2010-12-01 DIAGNOSIS — R1310 Dysphagia, unspecified: Secondary | ICD-10-CM

## 2010-12-01 DIAGNOSIS — I10 Essential (primary) hypertension: Secondary | ICD-10-CM

## 2010-12-01 DIAGNOSIS — E876 Hypokalemia: Secondary | ICD-10-CM

## 2010-12-01 DIAGNOSIS — R5381 Other malaise: Secondary | ICD-10-CM

## 2010-12-01 DIAGNOSIS — Z5189 Encounter for other specified aftercare: Secondary | ICD-10-CM

## 2010-12-01 LAB — GLUCOSE, CAPILLARY: Glucose-Capillary: 130 mg/dL — ABNORMAL HIGH (ref 70–99)

## 2010-12-03 LAB — BASIC METABOLIC PANEL
BUN: 13 mg/dL (ref 6–23)
Calcium: 8.5 mg/dL (ref 8.4–10.5)
Creatinine, Ser: 0.61 mg/dL (ref 0.50–1.10)
GFR calc Af Amer: >60 mL/min (ref >60)

## 2010-12-03 LAB — TSH: TSH: 0.01 u[IU]/mL — ABNORMAL LOW (ref 0.350–4.500)

## 2010-12-03 LAB — PREALBUMIN: Prealbumin: 8 mg/dL — ABNORMAL LOW (ref 17.0–34.0)

## 2010-12-04 DIAGNOSIS — R1310 Dysphagia, unspecified: Secondary | ICD-10-CM

## 2010-12-04 DIAGNOSIS — R5381 Other malaise: Secondary | ICD-10-CM

## 2010-12-04 DIAGNOSIS — I10 Essential (primary) hypertension: Secondary | ICD-10-CM

## 2010-12-04 DIAGNOSIS — Z5189 Encounter for other specified aftercare: Secondary | ICD-10-CM

## 2010-12-04 DIAGNOSIS — E876 Hypokalemia: Secondary | ICD-10-CM

## 2010-12-12 ENCOUNTER — Other Ambulatory Visit (HOSPITAL_COMMUNITY): Payer: Self-pay | Admitting: Family Medicine

## 2010-12-12 NOTE — Discharge Summary (Signed)
Brittney Yang, TARMAN NO.:  [id number]  MEDICAL RECORD NO.:  [id number]  LOCATION:  4029                         FACILITY:  [facility]  PHYSICIAN:  Ranelle Oyster, M.D.DATE OF BIRTH:  [birth year]  DATE OF ADMISSION:  11/21/2010 DATE OF DISCHARGE:  12/05/2010                              DISCHARGE SUMMARY   DISCHARGE DIAGNOSES: 1. Deconditioning with multi-medical/pancytopenia/questionable     Parkinson's disease. 2. Dementia. 3. Hypokalemia. 4. Dysphagia. 5. Hyperthyroidism. 6. Malignant hypertension.  This is an 77 year old white female with hyperthyroidism with thyroidectomy in 1960, recently placed on methimazole 5 mg daily since April 2012 per her primary care provider, who presented on May 26 with generalized weakness, nausea, vomiting.  Creatinine of 2.40, WBC of 1.3, platelet 32,000.  Cranial CT scan negative for acute changes, but did note significant small vessel disease.  Hematology consulted secondary to pancytopenia, felt to be due to the methimazole and discontinued.  CT of the abdomen and pelvis negative.  C. diff negative.  Renal function improved with general intravenous fluids.  Creatinine improved to 0.47. Renal ultrasound negative for hydronephrosis.  Started on septic shock protocol.  Latest labs, white blood cell count of 4.4, platelet 120,000. Modified barium swallow, June 1, placed on dysphagia III honey-thick liquid diet.  Hypokalemia, she did receive potassium supplement.  She remained on Aricept for history of dementia.  Dr. Lucianne Muss of Endocrinology Service was to follow up concerning her hypothyroidism.  She was total assist for transfers.  She was admitted for comprehensive rehab program.  PAST MEDICAL HISTORY:  See discharge diagnoses.  No alcohol or tobacco.  ALLERGIES:  PENICILLIN.  SOCIAL HISTORY:  Lives alone.  Son and daughter in the area.  Family plans to arrange assistance.  One-level home, two steps to entry. Functional  history prior to admission was independent.  Functional status upon admission to rehab service was moderate to max assist bed mobility, +2 total assist transfers, attempts to ambulate, but knees tend to buckle, total assist activities of daily living.  MEDICATIONS PRIOR TO ADMISSION: 1. Aspirin daily. 2. Bisoprolol with hydrochlorothiazide 2.5/6.25 mg daily. 3. Librium 5 mg twice daily. 4. Aricept 10 mg daily. 5. B12 daily. 6. Methimazole started April 2012.  PHYSICAL EXAM:  VITAL SIGNS:  Blood pressure 175/66, pulse 107, temperature 97.5, respirations 20. GENERAL:  This was an alert female, in no acute distress, flat affect, follows simple commands, delayed processing and need some cues with decreased memory, makes good eye contact with examiner, withdraws to deep stimuli. EXTREMITIES:  A PRAFO brace to the left lower extremity secondary to a small scabbed area to the heel without drainage. LUNGS:  Clear to auscultation. CARDIAC:  Regular rate and rhythm. ABDOMEN:  Soft, nontender.  Good bowel sounds.  REHABILITATION HOSPITAL COURSE:  The patient was admitted to Inpatient Rehab Services with therapies initiated on a 3-hour daily basis consisting of physical therapy, occupational therapy, speech therapy, and rehabilitation nursing.  The following issues were addressed during the patient's rehabilitation stay.  Pertaining to [name]'s deconditioning, multi-medical history of dementia, remained stable.  A bed alarm was then placed for the patient's safety.  She did have initial bouts  of hypokalemia of 2.8, that was supplemented, latest potassium levels improved to 3.7.  She remained on her Aricept and Librium for history of dementia.  She was followed by Endocrinology Services for hyperthyroidism.  Workup per methimazole had since been discontinued.  Latest TSH level of 0.010, free T4 of 2.74.  Dr. Lucianne Muss continued to work on this as she did not receive her iodine treatment due  to dysphagia and her medication was not able to be crushed, thus Dr. Lucianne Muss was to follow up with ongoing care in relation to her hyperthyroidism.  With her dysphagia, she was currently on a dysphagia II honey-thick liquid diet after latest modified barium swallow completed on June 13.  She had been on intravenous fluids to monitor of hydration.  This was discontinued on June 14.  Latest chemistries again continued to show maintenance of renal function with latest creatinine of 0.6, BUN of 13, however, home health nurse would be arranged for a follow-up chemistry in approximately 1 week to monitor renal function. Her blood pressures remained well controlled.  She was on low-dose Tenormin 12.5 mg daily.  No orthostatic changes noted.  The patient received weekly collaborative interdisciplinary team conferences to discuss estimated length of stay, family teaching, and any barriers to discharge.  She was still max assist for mobility, needing assistance for safety, decreased awareness noted, also encouragement to participate minimum to moderate assist for activities of daily living.  As her mobility improved, she was now handheld assist for 120 feet in a controlled environment.  She was able to navigate stairs with supervision.  However, due to her decreased insight, it was again advised supervision for her safety.  Home health therapies would be ongoing  Latest labs showed a T4 2.74, TSH of 0.010.  Sodium 132, potassium 3.7, BUN 13, creatinine 0.6.  Hemoglobin 10.6, hematocrit 32.3, WBC 2.8, platelet 163,000.  DISCHARGE MEDICATIONS:  At the time of dictation included: 1. Tenormin 12.5 mg daily. 2. Calcium. 3. Tums 2 tablets three times daily. 4. Librium 5 mg twice daily. 5. Aricept 10 mg at bedtime. 6. Ensure supplements twice daily. 7. Magnesium oxide 400 mg twice daily. 8. Multivitamin daily. 9. Protonix 40 mg daily. 10.Potassium chloride 20 mEq daily. 11.Florastor 500 mg twice  daily. 12.Tylenol as needed.  DIET:  Dysphagia II honey-thick liquid diet with encouragement of honey- thick liquids with the assistance of the family.  A home health nurse had been arranged for necessary chemistries in 1 week to monitor renal functions.  She should follow up Dr. Lucianne Muss, Endocrinology Services in relation to ongoing care and workup of hyperthyroidism, Dr. Faith Rogue, the outpatient rehab service office as needed.  Home health therapies had been arranged.     Mariam Dollar, P.A.   ______________________________ Ranelle Oyster, M.D.    DA/MEDQ  D:  12/04/2010  T:  12/05/2010  Job:  [id number]  cc:   Gloriajean Dell. Andrey Campanile, M.D. Reather Littler, M.D. Lowella Dell, M.D.  Electronically Signed by Mariam Dollar P.A. on 12/05/2010 03:53:49 PM Electronically Signed by Faith Rogue M.D. on 12/12/2010 09:33:02 AM

## 2010-12-12 NOTE — H&P (Signed)
Brittney Yang, WELD NO.:  [id number]  MEDICAL RECORD NO.:  [id number]  LOCATION:  4029                         FACILITY:  [facility]  PHYSICIAN:  Ranelle Oyster, M.D.DATE OF BIRTH:  04-15-1928  DATE OF ADMISSION:  11/21/2010 DATE OF DISCHARGE:                             HISTORY & PHYSICAL   CHIEF COMPLAINT:  Weakness and deconditioning.  PRIMARY CARE PROVIDER:  Gloriajean Dell. Andrey Campanile, MD  HISTORY OF PRESENT ILLNESS:  An 77 year old white female recently placed on methimazole by her PCP.  She presented on Nov 11, 2010, with generalized weakness, nausea and vomiting.  Creatinine was increased to 2.4.  White counts were decreased to 1300 and platelets were 32,000.  CT showed it was negative for any changes in the brain, but did note significant small-vessel disease.  Hematology was consulted and felt that the pancytopenia was drug-induced.  Her methimazole was stopped. CT of the abdomen and pelvis was negative.  Renal function improved with IV fluids.  She was started on septic shock protocol at the time of admission as well.  Modified barium swallow was done on November 17, 2010, and patient was placed on D3, honey-thick liquid diets, plan to repeat study soon.  Her low potassium levels were replaced.  She remained on Aricept and Librium as prior arrival for mild dementia.  Endocrinology to follow up on plans for treatment of patient's hyperthyroidism.  Rehab was consulted.  It was felt that she could benefit from rehab stay in her deconditioned state.  I examined the patient felt that she could benefit along as long as she had adequate social supports.  REVIEW OF SYSTEMS:  Notable for the above.  The patient is a poor historian, very flat, slow to respond.  Family reports poor appetite overall as well as poor initiation and decreased mood.  PAST MEDICAL HISTORY:  Positive for dementia, hypertension, B12 deficiency, hyperthyroidism with thyroidectomy in the 60s.  There  is no alcohol or tobacco history.  FAMILY HISTORY:  Positive for CAD.  SOCIAL HISTORY:  The patient lives alone.  Son and daughters are in the area.  Family plans to assist as needed at home.  She is living in a house with two steps to enter.  ALLERGIES:  PENICILLIN.  MEDICATIONS AT HOME:  Aspirin, bisoprolol/HCTZ, Librium Aricept, B12, and methimazole as above.  LABORATORY DATA:  Hemoglobin 9.1, platelets 120,000.  Sodium 143, potassium 2.8, BUN 10, creatinine 0.4.  PHYSICAL EXAMINATION:  VITAL SIGNS:  Blood pressure is 175/66, pulse 107, respiratory rate 20, temperature 97.5. GENERAL:  The patient is flat, fairly attentive. HEENT:  Pupils equal, round, and reactive to light.  Ear, nose, and throat exam is unremarkable except for thrush over the tongue.  Mucosa is very dry as well. NECK:  Supple without JVD or lymphadenopathy. CHEST:  Clear to auscultation bilaterally without wheezes, rales, or rhonchi. HEART:  Regular rate and rhythm without murmur, rubs, or gallops. ABDOMEN:  Soft, nontender.  Bowel sounds are positive. SKIN:  Notable for few chronic changes, but otherwise unremarkable. NEUROLOGICALLY:  Cranial nerves II-XII were nonfocal though patient seemed to have poor oral motor control and speech was very dysarthric and  dysphonic.  Reflexes are 1+.  Sensation was diminished perhaps in distal limbs, although this was difficult to tell due to patient's poor attention.  The patient had very poor initiation and awareness. MUSCULOSKELETAL:  Strength is grossly 2-3/5 proximal and distal in both upper and lower extremities.  Again, the patient had a hard time focusing initiating movement.  There seemed to be some apraxia even at times.  Judgment was poor.  Orientation intact to name.  Memory was poor.  Mood was flat.  POST ADMISSION PHYSICIAN EVALUATION: 1. Functional deficit secondary to deconditioning related to multiple     medical issues as outlined above.  There is  question of sepsis as     well.  Also question whether this patient may have had a small     brainstem infarct based on exam. 2. The patient is admitted to receive collaborative interdisciplinary     care between the physiatrist rehab nursing staff and therapy team. 3. The patient's level of medical complexity and substantial therapy     needs in context of that medical necessity cannot be provided at a     lesser intensity of care.  Medical complications include     pancytopenia, hyperkalemia, dysphagia with relative dehydration,     hyperthyroidism, malignant hypertension, and question of small     stroke. 4. The patient has experienced substantial functional loss from her     baseline.  Premorbidly, the patient was living alone, and     independent apparently.  Currently, she is mod to max assist bed     mobility, +2 total assist transfer 60%, and attempts to ambulate     out of bed resulted in buckling of the knees.  She is total assist     with ADLs.  Judging by the patient's diagnosis, physical exam, and     functional history, she has potential for functional progress which     will result in measurable gains while inpatient rehab.  Her gains     will be of substantial and practical use upon discharge to home     with facilitating mobility and self-care.  Interim changes since     our rehab consult were detailed above. 5. Physiatrist will provide 24-hour management of medical needs as     well as oversight of therapy plan/treatment and provide guidance as     appropriate regarding interaction of the two.  Medical problem list     and plan are below. 6. A 24-hour rehab nursing team will assist in the management of     patient's nutrition as well as bowel and bladder function,     integration therapy concepts and techniques. 7. PT will assess and treat for lower extremity strength, range of     motion, functional mobility, gait, adaptive techniques, equipment     safety, and  neuromuscular education, perceptual training and test     with goals supervision to min assist. 8. OT will assess and treat for upper extremity use ADLs, adaptive     techniques, equipment, neuromuscular education, safety awareness,     upper extremity strength with goals supervision to perhaps     occasional mod assist even with higher-level tasks. 9. Speech language pathology will follow for any cognitive issues as     well as speech intelligibility, communication, language, and     cognition with goals supervision to mod assist. 10.Case management/social worker will assess and treat for     psychosocial issues and discharge  planning. 11.Team conferences will be held weekly to assess progress towards     goals and to determine barriers at discharge. 12.The patient demonstrated sufficient medical stability and exercise     capacity to tolerate at least 3 hours therapy per day at least 5     days per week. 13.Estimated length of stay is 2 plus weeks.  Prognosis is good.  MEDICAL PROBLEM LIST AND PLAN: 1. DVT prophylaxis thigh-high TED hose.  Encourage movement.  We will     hold off on any heparin or Lovenox given patient's recent drop in     platelets. 2. Pain management p.r.n. Tylenol given her poor arousal level today. 3. Mood:  The patient will remain on Aricept and Librium for her     baseline dementia.  Need to discuss further with family regarding     the extent of this.  The patient was living alone which would     suggest that she was at a much higher level cognitive function and     she presents today.  Bed alarm will be in place for safety and     fall.  Plan will be in place per team.  We will consider MRI of the     brain if I see no substantial progress, but the morning to discern     if there was a small stroke that took place perhaps subcortically     in the setting of her baseline dementia and a small vessel disease. 4. Hypokalemia:  KCl 20 mg p.o. b.i.d. in place.   We will check lytes     in the morning and encourage adequate fluid intake.  IV fluids will     be given q.h.s. 5. Dysphagia:  D3, honey-thick liquids. 6. Hyperthyroidism:  Dr. Lucianne Muss to follow up regarding patient's     continued care needs. 7. Hypertension:  The patient is on no meds at present.  We will     monitor activities and blood pressures when she begins to move more     therapy.  Her systolic blood pressure was elevated today.  She was     on bisoprolol and HCTZ prior to coming to the hospital.     Ranelle Oyster, M.D.     ZTS/MEDQ  D:  11/21/2010  T:  11/22/2010  Job:  [id number]  cc:   Gloriajean Dell. Andrey Campanile, M.D.  Electronically Signed by Faith Rogue M.D. on 12/12/2010 09:32:58 AM

## 2010-12-16 NOTE — Discharge Summary (Signed)
  [name], Brittney Yang                 ACCOUNT NO.:  [id number]  MEDICAL RECORD NO.:  [id number]  LOCATION:                                 FACILITY:  PHYSICIAN:  Lonia Blood, M.D.       DATE OF BIRTH:  [birth year]  DATE OF ADMISSION:  11/11/2010 DATE OF DISCHARGE:  11/21/2010                              DISCHARGE SUMMARY   PRIMARY CARE PHYSICIAN:  Gloriajean Dell. Andrey Campanile, MD.  DISCHARGE DIAGNOSES: 1. Pancytopenia, felt to be due to methimazole, resolved. 2. Hyperthyroidism. 3. Anxiety. 4. Dementia. 5. Severe protein-caloric malnutrition. 6. Severe hypokalemia. 7. Severe hypomagnesemia. 8. Severe hypocalcemia. 9. Presumed sepsis, all cultures negative. 10.History of B12 deficiency.  DISCHARGE MEDICATIONS:  Refer to the computer-generated discharge medication document.  CONDITION ON DISCHARGE:  Brittney Yang was transferred to certified inpatient rehabilitation at [facility].  HOSPITAL COURSE:  Refer to dictated progress note done by Dr. Onalee Hua on November 21, 2010.  To summarize on November 22, 2010, Brittney Yang was felt to be stable and read transferred to the inpatient rehab service.  She was hemodynamically stable with temperature 97.0, heart rate 100, respiratory rate 18, blood pressure 115/64, saturation 93% on room air. Prior to her discharge, Dr. Lucianne Muss from Endocrinology was consulted to help [place] further address the hyperthyroidism.  The patient's potassium was adequately repleted and she continued to be followed in the inpatient rehabilitation unit by the physical medicine rehabilitation doctors.     Lonia Blood, M.D.     SL/MEDQ  D:  12/03/2010  T:  12/04/2010  Job:  [id number]  Electronically Signed by Lonia Blood M.D. on 12/16/2010 04:42:16 PM

## 2010-12-21 ENCOUNTER — Ambulatory Visit (HOSPITAL_COMMUNITY)
Admission: RE | Admit: 2010-12-21 | Discharge: 2010-12-21 | Disposition: A | Discharge status: Home / Self Care | Payer: Medicare Other | Source: Ambulatory Visit | Attending: Family Medicine | Admitting: Family Medicine

## 2010-12-21 DIAGNOSIS — R1310 Dysphagia, unspecified: Secondary | ICD-10-CM | POA: Insufficient documentation

## 2010-12-22 DIAGNOSIS — Z0271 Encounter for disability determination: Secondary | ICD-10-CM

## 2011-01-05 NOTE — Consult Note (Signed)
NAMEDIMA, MINI NO.:  [id number]  MEDICAL RECORD NO.:  [id number]  LOCATION:                                 FACILITY:  PHYSICIAN:  Reather Littler, M.D.       DATE OF BIRTH:  1927/09/26  DATE OF CONSULTATION: DATE OF DISCHARGE:                                CONSULTATION   REASON FOR CONSULTATION:  Management of hyperthyroidism.  HISTORY:  This is an 77 year old lady who was apparently evaluated by her primary care physician about a month ago and for some reason her thyroid tests were done and she was told to have hyperthyroidism.  The patient was started on methimazole and it is unclear what dosage she was taking.  The patient and her daughter feel that she was fairly asymptomatic at that time with no recent weight loss, palpitations, shakiness, sweating, fatigue or diarrhea problems.  Not clear of any other labs were done or any nuclear studies were done on her thyroid. The patient, however, about 3 weeks later started having decreased appetite, fatigue, weakness and also feeling cold as well as losing weight.  She was also starting to have vomiting and diarrhea and was subsequently admitted to the hospital because of the symptoms along with a white blood cell count of 1300 and platelet count of 32,000.  She also had some dehydration with a creatinine of 2.4 and potassium of 3.2.  The patient was given supportive management and hydration and improved.  The patient was also treated with antibiotics.  Notably, her TSH level was 0.08 and free T4 was slightly high at 1.89.  Because of her continued general poor health, she was transferred to rehab for further management.  PAST HISTORY:  She had thyroidectomy for goiter about 20 years previously.  She reportedly also has had B12 deficiency.  MEDICATIONS AT HOME:  Ziac, aspirin, Librium and Aricept.  She was also previously on 5 mg of methimazole.  FAMILY HISTORY:  Positive for coronary artery  disease.  SOCIAL HISTORY:  She is widowed and lives alone.  Does not use alcohol or tobacco.  REVIEW OF SYSTEMS:  There is no history of diabetes.  She has not had any problem with headaches, vision.  She does have dementia.  She has not had any problems with chest pain, shortness of breath.  She is still having some decreased appetite but no constipation or diarrhea.  Does not have any numbness in her legs and weakness of her extremities.  She does feel generally fatigued recently and not very conversant.  PHYSICAL EXAMINATION:  GENERAL:  The patient is asthenic looking and rather thinly built. VITAL SIGNS:  Her pulse is 92 per minute, blood pressure is 115/70. EYES:  Externally normal.  There is no lid lag or stare.  No proptosis or lid edema. MOUTH:  Mucous membranes show dry coated tongue. NECK:  No thyroid enlargement, no carotid bruit, no lymphadenopathy. HEART:  Sounds are normal. LUNGS:  Clear. ABDOMEN:  No mass or tenderness.  No distention. EXTREMITIES:  No skin lesions or edema.  No clubbing. NEUROLOGIC:  Her deep tendon reflexes at the biceps are brisk.  ASSESSMENT:  The patient does have significant history of hyperthyroidism, etiology of this is not clear as the goiter is not palpable.  Her free T4 done about 2 days ago was high normal at 1.81 and T3 was 3.7.  RECOMMENDATIONS:  Since she probably will need treatment of her hyperthyroidism to prevent cardiac problems will like to do  I-131 treatment on the day of her discharge from the hospital.  Currently, she is fairly asymptomatic except for tachycardia.  She will need I-131 uptakes before this is done and since she will not be able to have this done this week we will have to postpone this for next week.  I would also recommend a beta blocker for her tachycardia.     Reather Littler, M.D.     AK/MEDQ  D:  11/23/2010  T:  11/24/2010  Job:  [id number]  cc:   Ranelle Oyster, M.D. Gloriajean Dell. Andrey Campanile,  M.D.  Electronically Signed by Reather Littler M.D. on 01/05/2011 02:52:50 PM

## 2011-01-19 ENCOUNTER — Other Ambulatory Visit (HOSPITAL_COMMUNITY): Payer: Self-pay | Admitting: Endocrinology

## 2011-01-19 DIAGNOSIS — E0590 Thyrotoxicosis, unspecified without thyrotoxic crisis or storm: Secondary | ICD-10-CM

## 2011-01-24 ENCOUNTER — Ambulatory Visit (HOSPITAL_COMMUNITY): Payer: Medicare Other

## 2011-01-25 ENCOUNTER — Encounter (HOSPITAL_COMMUNITY): Payer: Medicare Other

## 2011-02-01 ENCOUNTER — Encounter (HOSPITAL_COMMUNITY)
Admission: RE | Admit: 2011-02-01 | Discharge: 2011-02-01 | Disposition: A | Discharge status: Home / Self Care | Payer: Medicare Other | Source: Ambulatory Visit | Attending: Endocrinology | Admitting: Endocrinology

## 2011-02-01 DIAGNOSIS — E0590 Thyrotoxicosis, unspecified without thyrotoxic crisis or storm: Secondary | ICD-10-CM

## 2011-02-02 ENCOUNTER — Encounter (HOSPITAL_COMMUNITY)
Admission: RE | Admit: 2011-02-02 | Discharge: 2011-02-02 | Disposition: A | Discharge status: Home / Self Care | Payer: Medicare Other | Source: Ambulatory Visit | Attending: Endocrinology | Admitting: Endocrinology

## 2011-02-02 DIAGNOSIS — R259 Unspecified abnormal involuntary movements: Secondary | ICD-10-CM | POA: Insufficient documentation

## 2011-02-02 DIAGNOSIS — R1310 Dysphagia, unspecified: Secondary | ICD-10-CM | POA: Insufficient documentation

## 2011-02-02 DIAGNOSIS — E0590 Thyrotoxicosis, unspecified without thyrotoxic crisis or storm: Secondary | ICD-10-CM | POA: Insufficient documentation

## 2011-02-02 DIAGNOSIS — R002 Palpitations: Secondary | ICD-10-CM | POA: Insufficient documentation

## 2011-02-02 DIAGNOSIS — R634 Abnormal weight loss: Secondary | ICD-10-CM | POA: Insufficient documentation

## 2011-02-02 MED ORDER — SODIUM IODIDE I 131 CAPSULE
11.7000 | Freq: Once | INTRAVENOUS | Status: AC | PRN
  Administered 2011-02-01: 11.7 via ORAL

## 2011-02-02 MED ORDER — SODIUM IODIDE I 131 CAPSULE
11.2000 | Freq: Once | INTRAVENOUS | Status: AC | PRN
  Administered 2011-02-01: 11.2 via ORAL

## 2011-02-16 ENCOUNTER — Ambulatory Visit (HOSPITAL_COMMUNITY)
Admission: RE | Admit: 2011-02-16 | Discharge: 2011-02-16 | Disposition: A | Discharge status: Home / Self Care | Payer: Medicare Other | Source: Ambulatory Visit | Attending: Endocrinology | Admitting: Endocrinology

## 2011-02-16 ENCOUNTER — Other Ambulatory Visit (HOSPITAL_COMMUNITY): Payer: Self-pay | Admitting: Endocrinology

## 2011-02-16 DIAGNOSIS — E0590 Thyrotoxicosis, unspecified without thyrotoxic crisis or storm: Secondary | ICD-10-CM

## 2011-02-16 MED ORDER — SODIUM IODIDE I 131 CAPSULE
20.0000 | Freq: Once | INTRAVENOUS | Status: AC | PRN
  Administered 2011-02-16: 20 via ORAL

## 2011-05-28 ENCOUNTER — Other Ambulatory Visit: Payer: Self-pay | Admitting: Endocrinology

## 2011-05-28 DIAGNOSIS — E0590 Thyrotoxicosis, unspecified without thyrotoxic crisis or storm: Secondary | ICD-10-CM

## 2011-06-06 ENCOUNTER — Encounter (HOSPITAL_COMMUNITY)
Admission: RE | Admit: 2011-06-06 | Discharge: 2011-06-06 | Disposition: A | Discharge status: Home / Self Care | Payer: Medicare Other | Source: Ambulatory Visit | Attending: Endocrinology | Admitting: Endocrinology

## 2011-06-06 DIAGNOSIS — E0590 Thyrotoxicosis, unspecified without thyrotoxic crisis or storm: Secondary | ICD-10-CM

## 2011-06-07 ENCOUNTER — Ambulatory Visit (HOSPITAL_COMMUNITY)
Admission: RE | Admit: 2011-06-07 | Discharge: 2011-06-07 | Disposition: A | Discharge status: Home / Self Care | Payer: Medicare Other | Source: Ambulatory Visit | Attending: Endocrinology | Admitting: Endocrinology

## 2011-06-07 DIAGNOSIS — E0590 Thyrotoxicosis, unspecified without thyrotoxic crisis or storm: Secondary | ICD-10-CM | POA: Insufficient documentation

## 2011-06-07 DIAGNOSIS — R946 Abnormal results of thyroid function studies: Secondary | ICD-10-CM | POA: Insufficient documentation

## 2011-06-07 DIAGNOSIS — E041 Nontoxic single thyroid nodule: Secondary | ICD-10-CM | POA: Insufficient documentation

## 2011-06-07 MED ORDER — SODIUM IODIDE I 131 CAPSULE
7.3000 | Freq: Once | INTRAVENOUS | Status: AC | PRN
  Administered 2011-06-06: 7.3 via ORAL

## 2011-06-07 MED ORDER — SODIUM PERTECHNETATE TC 99M INJECTION
10.0000 | Freq: Once | INTRAVENOUS | Status: AC | PRN
  Administered 2011-06-07: 10 via INTRAVENOUS

## 2011-06-08 ENCOUNTER — Other Ambulatory Visit: Payer: Self-pay | Admitting: Endocrinology

## 2011-06-08 DIAGNOSIS — E0590 Thyrotoxicosis, unspecified without thyrotoxic crisis or storm: Secondary | ICD-10-CM

## 2011-06-13 ENCOUNTER — Other Ambulatory Visit (HOSPITAL_COMMUNITY): Payer: Self-pay | Admitting: Endocrinology

## 2011-06-13 DIAGNOSIS — E0590 Thyrotoxicosis, unspecified without thyrotoxic crisis or storm: Secondary | ICD-10-CM

## 2011-06-14 ENCOUNTER — Ambulatory Visit (HOSPITAL_COMMUNITY): Payer: Medicare Other

## 2011-06-15 ENCOUNTER — Other Ambulatory Visit (HOSPITAL_COMMUNITY): Payer: Medicare Other

## 2011-06-18 ENCOUNTER — Encounter (HOSPITAL_COMMUNITY)
Admission: RE | Admit: 2011-06-18 | Discharge: 2011-06-18 | Disposition: A | Discharge status: Home / Self Care | Payer: Medicare Other | Source: Ambulatory Visit | Attending: Endocrinology | Admitting: Endocrinology

## 2011-06-18 DIAGNOSIS — E0590 Thyrotoxicosis, unspecified without thyrotoxic crisis or storm: Secondary | ICD-10-CM | POA: Insufficient documentation

## 2011-06-18 MED ORDER — SODIUM IODIDE I 131 CAPSULE
22.2000 | Freq: Once | INTRAVENOUS | Status: AC | PRN
  Administered 2011-06-18: 22.2 via ORAL

## 2013-03-31 ENCOUNTER — Ambulatory Visit (INDEPENDENT_AMBULATORY_CARE_PROVIDER_SITE_OTHER): Payer: Medicare Other | Admitting: Endocrinology

## 2013-03-31 ENCOUNTER — Encounter: Payer: Self-pay | Admitting: Endocrinology

## 2013-03-31 VITALS — BP 140/90 | HR 81 | Temp 98.4°F | Resp 12 | Ht 60.0 in | Wt 115.7 lb

## 2013-03-31 DIAGNOSIS — I10 Essential (primary) hypertension: Secondary | ICD-10-CM

## 2013-03-31 DIAGNOSIS — E890 Postprocedural hypothyroidism: Secondary | ICD-10-CM

## 2013-03-31 LAB — COMPREHENSIVE METABOLIC PANEL
Albumin: 4.5 g/dL (ref 3.5–5.2)
Alkaline Phosphatase: 54 U/L (ref 39–117)
BUN: 22 mg/dL (ref 6–23)
Calcium: 9.2 mg/dL (ref 8.4–10.5)
Creatinine, Ser: 1.3 mg/dL — ABNORMAL HIGH (ref 0.4–1.2)
Glucose, Bld: 95 mg/dL (ref 70–99)
Potassium: 4.5 mEq/L (ref 3.5–5.1)

## 2013-03-31 LAB — T4, FREE: Free T4: 1.01 ng/dL (ref 0.60–1.60)

## 2013-03-31 NOTE — Progress Notes (Signed)
Patient ID: Brittney Yang, female   DOB: 21-Jun-1927, 79 y.o.   MRN: [id number]  Reason for Appointment:  Hypothyroidism, followup visit    History of Present Illness:   The hypothyroidism was first diagnosed in 08/2011  She had 2 doses of reactive iodine for her hyperthyroidism before she became hypothyroid Did not have any significant symptoms at the onset of hypothyroidism Has been taking relatively low doses of supplements of levothyroxine and her TSH was 1.97 on her last visit in April Has been taking 50 mcg of levothyroxine  Complaints are reported by the patient now are no unusual fatigue and also has  less hair loss       She apparently has had decreased appetite for the last 2-4 weeks and has lost a little weight. Has been seen by PCP Compliance with the medical regimen has been as prescribed with taking the tablet in the morning before breakfast.      Medication List       This list is accurate as of: 03/31/13 11:18 AM.  Always use your most recent med list.               aspirin 325 MG tablet  Take 325 mg by mouth daily.     chlordiazePOXIDE 5 MG capsule  Commonly known as:  LIBRIUM  Take 5 mg by mouth 3 (three) times daily as needed for anxiety.     cyanocobalamin 2000 MCG tablet  Take 2,000 mcg by mouth daily.     donepezil 10 MG tablet  Commonly known as:  ARICEPT  Take 10 mg by mouth at bedtime as needed.     haloperidol 0.5 MG tablet  Commonly known as:  HALDOL  Take 0.5 mg by mouth 2 (two) times daily.     levothyroxine 50 MCG tablet  Commonly known as:  SYNTHROID, LEVOTHROID  Take 50 mcg by mouth daily before breakfast.        No past medical history on file.  No past surgical history on file.  No family history on file.  Social History:  reports that she has never smoked. She has never used smokeless tobacco. Her alcohol and drug histories are not on file.  Allergies:  Allergies  Allergen Reactions  . Methimazole [Thiamazole]   .  Penicillins    REVIEW of systems:  She has had anxiety She has had dementia Previously had history of mild hypertension She was told by PCP that her kidney function is not normal recently and was told to drink more water   Examination:   BP 140/90  Pulse 81  Temp(Src) 98.4 F (36.9 C)  Resp 12  Ht 5' (1.524 m)  Wt 115 lb 11.2 oz (52.481 kg)  BMI 22.6 kg/m2  SpO2 98%   GENERAL APPEARANCE:  fairly alert somewhat somewhat apathetic   No puffiness of face or periorbital edema.         NECK: no thyromegaly.          NEUROLOGIC EXAM: DTRs 2+ bilaterally at biceps.    Assessments   Hypothyroidism, post ablative and requiring relatively low doses of levothyroxine Will need to reassess her thyroid functions today and adjust medication if needed   ? Renal dysfunction, will check her creatinine again and forward to PCP  Addendum: Labs as follows  Osie Merkin 03/31/2013, 11:18 AM   Office Visit on 03/31/2013  Component Date Value Range Status  . Free T4 03/31/2013 1.01  0.60 - 1.60 ng/dL Final  .  TSH 03/31/2013 1.48  0.35 - 5.50 uIU/mL Final  . Sodium 03/31/2013 140  135 - 145 mEq/L Final  . Potassium 03/31/2013 4.5  3.5 - 5.1 mEq/L Final  . Chloride 03/31/2013 101  96 - 112 mEq/L Final  . CO2 03/31/2013 29  19 - 32 mEq/L Final  . Glucose, Bld 03/31/2013 95  70 - 99 mg/dL Final  . BUN 57/84/6962 22  6 - 23 mg/dL Final  . Creatinine, Ser 03/31/2013 1.3* 0.4 - 1.2 mg/dL Final  . Total Bilirubin 03/31/2013 0.7  0.3 - 1.2 mg/dL Final  . Alkaline Phosphatase 03/31/2013 54  39 - 117 U/L Final  . AST 03/31/2013 21  0 - 37 U/L Final  . ALT 03/31/2013 13  0 - 35 U/L Final  . Total Protein 03/31/2013 7.8  6.0 - 8.3 g/dL Final  . Albumin 95/28/4132 4.5  3.5 - 5.2 g/dL Final  . Calcium 44/06/270 9.2  8.4 - 10.5 mg/dL Final  . GFR 53/66/4403 41.94* >60.00 mL/min Final

## 2013-04-02 ENCOUNTER — Telehealth: Payer: Self-pay | Admitting: *Deleted

## 2013-04-02 NOTE — Telephone Encounter (Signed)
Message copied by Hermenia Bers on Thu Apr 02, 2013 11:25 AM ------      Message from: Reather Littler      Created: Wed Apr 01, 2013  8:00 AM       Thyroid level excellent, followup in 6 months      Kidney test slightly abnormal, will send to PCP ------

## 2013-04-02 NOTE — Telephone Encounter (Signed)
NA on home phone.

## 2013-04-03 ENCOUNTER — Telehealth: Payer: Self-pay | Admitting: Endocrinology

## 2013-04-03 NOTE — Telephone Encounter (Signed)
Results given to patient's daughter.

## 2013-07-27 ENCOUNTER — Other Ambulatory Visit: Payer: Self-pay | Admitting: *Deleted

## 2013-07-27 MED ORDER — LEVOTHYROXINE SODIUM 50 MCG PO TABS: 50.0000 ug | ORAL_TABLET | Freq: Every day | ORAL | Status: DC

## 2013-09-29 ENCOUNTER — Ambulatory Visit: Payer: Medicare Other | Admitting: Endocrinology

## 2013-10-02 ENCOUNTER — Encounter: Payer: Self-pay | Admitting: Endocrinology

## 2013-10-02 ENCOUNTER — Ambulatory Visit (INDEPENDENT_AMBULATORY_CARE_PROVIDER_SITE_OTHER): Payer: Medicare Other | Admitting: Endocrinology

## 2013-10-02 VITALS — BP 122/66 | HR 91 | Temp 97.9°F | Resp 14 | Ht 60.0 in | Wt 111.8 lb

## 2013-10-02 DIAGNOSIS — E890 Postprocedural hypothyroidism: Secondary | ICD-10-CM

## 2013-10-02 LAB — T4, FREE: Free T4: 0.84 ng/dL (ref 0.60–1.60)

## 2013-10-02 LAB — TSH: TSH: 4.83 u[IU]/mL (ref 0.35–5.50)

## 2013-10-02 NOTE — Progress Notes (Signed)
Patient ID: Brittney Yang, female   DOB: 02/25/1928, 80 y.o.   MRN: [id number]   Reason for Appointment:  Hypothyroidism, followup visit    History of Present Illness:   The hypothyroidism was first diagnosed in 08/2011  She had 2 doses of radioactive iodine for her hyperthyroidism before she became hypothyroid Did not have any significant symptoms at the onset of hypothyroidism Has been taking relatively low doses of supplement of levothyroxine and her TSH has been fairly consistent Has been recently taking 50 mcg of levothyroxine and her daughter helps her with the medication regularly  Complaints are reported by the patient now are no unusual fatigue or cold intolerance Patient is unable to give accurate history because of her dementia However has lost some weight, not eating as much again       Wt Readings from Last 3 Encounters:  10/02/13 111 lb 12.8 oz (50.712 kg)  03/31/13 115 lb 11.2 oz (52.481 kg)        Medication List       This list is accurate as of: 10/02/13 11:59 PM.  Always use your most recent med list.               aspirin 325 MG tablet  Take 325 mg by mouth daily.     chlordiazePOXIDE 5 MG capsule  Commonly known as:  LIBRIUM  Take 5 mg by mouth 3 (three) times daily as needed for anxiety.     cyanocobalamin 2000 MCG tablet  Take 2,000 mcg by mouth daily.     donepezil 10 MG tablet  Commonly known as:  ARICEPT  Take 10 mg by mouth at bedtime as needed.     haloperidol 0.5 MG tablet  Commonly known as:  HALDOL  Take 0.5 mg by mouth 2 (two) times daily.     levothyroxine 50 MCG tablet  Commonly known as:  SYNTHROID, LEVOTHROID  Take 1 tablet (50 mcg total) by mouth daily before breakfast.        No past medical history on file.  No past surgical history on file.  No family history on file.  Social History:  reports that she has never smoked. She has never used smokeless tobacco. Her alcohol and drug histories are not on  file.  Allergies:  Allergies  Allergen Reactions  . Methimazole [Thiamazole]   . Penicillins    REVIEW of systems:  She has had anxiety She has had dementia and is on Aricept and Haldol Previously had history of mild hypertension   Examination:   BP 122/66  Pulse 91  Temp(Src) 97.9 F (36.6 C)  Resp 14  Ht 5' (1.524 m)  Wt 111 lb 12.8 oz (50.712 kg)  BMI 21.83 kg/m2  SpO2 96%   GENERAL APPEARANCE:  fairly alert  No puffiness of face or periorbital edema.         NECK: no thyromegaly.          NEUROLOGIC EXAM:  reflexes 2+ bilaterally at biceps.    Assessments   Hypothyroidism, post ablative and requiring relatively low doses of levothyroxine Will need to reassess her thyroid functions today and adjust medication if needed    Brittney Yang 10/04/2013, 3:11 PM   Addendum: Labs as follows TSH is high normal and she can take extra 25 mcg weekly  Office Visit on 10/02/2013  Component Date Value Ref Range Status  . TSH 10/02/2013 4.83  0.35 - 5.50 uIU/mL Final  . Free T4 10/02/2013 0.84  0.60 - 1.60 ng/dL Final

## 2013-10-03 NOTE — Progress Notes (Signed)
Quick Note:  Thyroid level is low normal, continue 50 mcg but extra half tablet weekly, followup in 4 months instead of 6 ______

## 2014-01-01 ENCOUNTER — Ambulatory Visit (INDEPENDENT_AMBULATORY_CARE_PROVIDER_SITE_OTHER): Payer: Medicare Other | Admitting: Endocrinology

## 2014-01-01 ENCOUNTER — Other Ambulatory Visit (INDEPENDENT_AMBULATORY_CARE_PROVIDER_SITE_OTHER): Payer: Medicare Other

## 2014-01-01 ENCOUNTER — Encounter: Payer: Self-pay | Admitting: Endocrinology

## 2014-01-01 VITALS — BP 139/82 | HR 69 | Temp 98.4°F | Resp 14 | Ht 60.0 in | Wt 108.2 lb

## 2014-01-01 DIAGNOSIS — E890 Postprocedural hypothyroidism: Secondary | ICD-10-CM

## 2014-01-01 DIAGNOSIS — F0390 Unspecified dementia without behavioral disturbance: Secondary | ICD-10-CM

## 2014-01-01 LAB — TSH: TSH: 0.64 u[IU]/mL (ref 0.35–4.50)

## 2014-01-01 LAB — T4, FREE: FREE T4: 0.9 ng/dL (ref 0.60–1.60)

## 2014-01-01 NOTE — Progress Notes (Signed)
Patient ID: Brittney Yang, female   DOB: [birth year], 80 y.o.   MRN: [id number]   Reason for Appointment:  Hypothyroidism, followup visit    History of Present Illness:   The hypothyroidism was first diagnosed in 08/2011  She had 2 doses of radioactive iodine for her hyperthyroidism before she became hypothyroid Did not have any significant symptoms at the onset of hypothyroidism Has been taking relatively low doses of supplement of levothyroxine and her TSH had been fairly consistent Because of high normal TSH on her last visit her dose was increased slightly Has been recently taking 50 mcg of levothyroxine with extra 25 mcg weekly and her daughter helps her with the medication regularly  Complaints are reported by the patient now are none, no unusual fatigue or cold intolerance Patient is unable to give accurate history because of her dementia However has been lost some weight, not eating as much for no apparent reason and is seen to be recently       Wt Readings from Last 3 Encounters:  01/01/14 108 lb 3.2 oz (49.079 kg)  10/02/13 111 lb 12.8 oz (50.712 kg)  03/31/13 115 lb 11.2 oz (52.481 kg)        Medication List       This list is accurate as of: 01/01/14 11:59 PM.  Always use your most recent med list.               aspirin 325 MG tablet  Take 325 mg by mouth daily.     chlordiazePOXIDE 5 MG capsule  Commonly known as:  LIBRIUM  Take 5 mg by mouth 3 (three) times daily as needed for anxiety.     cyanocobalamin 2000 MCG tablet  Take 2,000 mcg by mouth daily.     donepezil 10 MG tablet  Commonly known as:  ARICEPT  Take 10 mg by mouth at bedtime as needed.     haloperidol 0.5 MG tablet  Commonly known as:  HALDOL  Take 0.5 mg by mouth 2 (two) times daily.     levothyroxine 50 MCG tablet  Commonly known as:  SYNTHROID, LEVOTHROID  Take 1 tablet (50 mcg total) by mouth daily before breakfast.        No past medical history on file.  No past surgical  history on file.  No family history on file.  Social History:  reports that she has never smoked. She has never used smokeless tobacco. Her alcohol and drug histories are not on file.  Allergies:  Allergies  Allergen Reactions  . Methimazole [Thiamazole]   . Penicillins    REVIEW of systems:  She has ? Parkinson's She has had worsening of dementia and is on Aricept and Haldol Previously had history of mild hypertension   Examination:   BP 139/82  Pulse 69  Temp(Src) 98.4 F (36.9 C)  Resp 14  Ht 5' (1.524 m)  Wt 108 lb 3.2 oz (49.079 kg)  BMI 21.13 kg/m2  SpO2 97%   GENERAL APPEARANCE:  fairly alert but somewhat apathetic        NECK: no thyromegaly.          NEUROLOGIC EXAM:  reflexes 2+ bilaterally at biceps.    Assessments   Hypothyroidism, post ablative and requiring relatively low doses of levothyroxine Her regimen currently is 50 mcg with extra half a tablet weekly Will need to reassess her thyroid functions today and adjust medication if needed    [facility] 01/02/2014, 3:03 PM  Addendum: Labs as follows, TSH normal and will continue same dose   Appointment on 01/01/2014  Component Date Value Ref Range Status  . TSH 01/01/2014 0.64  0.35 - 4.50 uIU/mL Final  . Free T4 01/01/2014 0.90  0.60 - 1.60 ng/dL Final

## 2014-01-02 NOTE — Progress Notes (Signed)
Quick Note:  Please let patient know that the lab result is normal and no change in medication, followup in 6 months ______

## 2014-01-12 ENCOUNTER — Encounter: Payer: Self-pay | Admitting: Neurology

## 2014-01-12 ENCOUNTER — Ambulatory Visit (INDEPENDENT_AMBULATORY_CARE_PROVIDER_SITE_OTHER): Payer: Medicare Other | Admitting: Neurology

## 2014-01-12 VITALS — BP 142/82 | HR 80 | Resp 14 | Ht 60.0 in | Wt 107.0 lb

## 2014-01-12 DIAGNOSIS — F0391 Unspecified dementia with behavioral disturbance: Secondary | ICD-10-CM

## 2014-01-12 DIAGNOSIS — F03918 Unspecified dementia, unspecified severity, with other behavioral disturbance: Secondary | ICD-10-CM

## 2014-01-12 DIAGNOSIS — G212 Secondary parkinsonism due to other external agents: Secondary | ICD-10-CM

## 2014-01-12 DIAGNOSIS — G219 Secondary parkinsonism, unspecified: Secondary | ICD-10-CM

## 2014-01-12 NOTE — Progress Notes (Signed)
Note faxed to Dr Benedetto GoadFred Wilson at [phone].

## 2014-01-12 NOTE — Patient Instructions (Signed)
1. Needs 24 hour a day care.  2. Follow up with Dr Andrey CampanileWilson.

## 2014-01-12 NOTE — Progress Notes (Addendum)
[place] [facility] Brittney Yang was seen today in the movement disorders clinic for neurologic consultation at the request of Pamelia HoitWILSON,FRED HENRY, MD.  The consultation is for the evaluation of tremor and to r/o PD.  Pt is accompanied by daughter who supplements the history.  Pts daughter states that she has noted tremor for 2 months or so.  She states that tremor is bilateral and she cannot get on clothes, as strength seems to have dissipated in her arms.  Pt lives at home at night by herself but has care all day long by family.  They put her into bed and then leave for the night.  Been on haldol bid for the last several years due to agitation and violent behavior and it made a huge difference in behavior.      Specific Symptoms:  Tremor: Yes.  , mostly seen with activation Voice: gotten soft over the last 3-4 months Sleep: sleeps well  Vivid Dreams:  unknown  Acting out dreams: unknown Postural symptoms:  Yes.    Falls?  No. Bradykinesia symptoms: shuffling gait, slow movements and difficulty getting out of a chair  Loss of smell:  No. Loss of taste:  No. Urinary Incontinence:  Yes.    At night Difficulty Swallowing:  No. Handwriting, micrographia: Yes.   Trouble with ADL's:  Yes.    Trouble buttoning clothing: Yes.   Depression:  No. Memory changes:  Yes.   (knows family; trouble with short term; family given meds for several years; daughter done finances for one year; pt has not driven for about one year; been on aricept for 8-9 years) Hallucinations:  No.  visual distortions: No. N/V:  No. Lightheaded:  No.  Syncope: No. Diplopia:  No. Dyskinesia:  No.  Neuroimaging has previously been performed.  It is available for my review today.  CT done in 11/2010 was unremarkable.   ALLERGIES:   Allergies  Allergen Reactions  . Methimazole [Thiamazole] Anaphylaxis  . Penicillins Hives    CURRENT MEDICATIONS:  Current Outpatient Prescriptions on File Prior to Visit  Medication Sig Dispense  Refill  . aspirin 325 MG tablet Take 325 mg by mouth daily.      . chlordiazePOXIDE (LIBRIUM) 5 MG capsule Take 5 mg by mouth at bedtime.       . cyanocobalamin 2000 MCG tablet Take 2,000 mcg by mouth daily.      Marland Kitchen. donepezil (ARICEPT) 10 MG tablet Take 10 mg by mouth at bedtime as needed.      . haloperidol (HALDOL) 0.5 MG tablet Take 0.5 mg by mouth 2 (two) times daily.      Marland Kitchen. levothyroxine (SYNTHROID, LEVOTHROID) 50 MCG tablet Take 1 tablet (50 mcg total) by mouth daily before breakfast.  30 tablet  5   No current facility-administered medications on file prior to visit.    PAST MEDICAL HISTORY:   Past Medical History  Diagnosis Date  . Thyroid disease   . Tremor   . Dementia     PAST SURGICAL HISTORY:   Past Surgical History  Procedure Laterality Date  . Thyroid surgery  1960    SOCIAL HISTORY:   History   Social History  . Marital Status: Widowed    Spouse Name: N/A    Number of Children: N/A  . Years of Education: N/A   Occupational History  . Not on file.   Social History Main Topics  . Smoking status: Never Smoker   . Smokeless tobacco: Never Used  .  Alcohol Use: No  . Drug Use: No  . Sexual Activity: Not on file   Other Topics Concern  . Not on file   Social History Narrative  . No narrative on file    FAMILY HISTORY:   Family Status  Relation Status Death Age  . Mother Deceased     heart disease  . Father Deceased     drowning  . Brother Deceased     heart attack  . Brother Deceased     heart disease, suicide  . Brother Deceased     CHF  . Brother Deceased     car accident  . Sister Alive     healthy  . Daughter Alive     high cholesterol  . Son Alive     heart disease    ROS:  A complete 10 system review of systems was obtained and was unremarkable apart from what is mentioned above.  PHYSICAL EXAMINATION:    VITALS:   Filed Vitals:   01/12/14 0833  BP: 142/82  Pulse: 80  Resp: 14  Height: 5' (1.524 m)  Weight: 107 lb  (48.535 kg)    GEN:  The patient appears stated age and is in NAD. HEENT:  Normocephalic, atraumatic.  The mucous membranes are moist. The superficial temporal arteries are without ropiness or tenderness. CV:  RRR Lungs:  CTAB Neck/HEME:  There are no carotid bruits bilaterally.  Neurological examination:  Orientation:  Montreal Cognitive Assessment  01/12/2014  Visuospatial/ Executive (0/5) 1  Naming (0/3) 2  Attention: Read list of digits (0/2) 2  Attention: Read list of letters (0/1) 0  Attention: Serial 7 subtraction starting at 100 (0/3) 1  Language: Repeat phrase (0/2) 2  Language : Fluency (0/1) 0  Abstraction (0/2) 1  Delayed Recall (0/5) 0  Orientation (0/6) 1  Total 10  Adjusted Score (based on education) 11    Cranial nerves: There is good facial symmetry. There is significant facial hypomimia.  Pupils are equal round and reactive to light bilaterally. Fundoscopic exam reveals clear margins bilaterally. Extraocular muscles are intact. The visual fields are full to confrontational testing. The speech is fluent and clear but lacks spontaneity. Soft palate rises symmetrically and there is no tongue deviation. Hearing is intact to conversational tone. Sensation: Sensation is intact to light and pinprick throughout (facial, trunk, extremities). Vibration is decreased but intact at the bilateral big toe. There is no extinction with double simultaneous stimulation. There is no sensory dermatomal level identified. Motor: Strength is 5/5 in the bilateral upper and lower extremities.   Shoulder shrug is equal and symmetric.  There is no pronator drift. Deep tendon reflexes: Deep tendon reflexes are 2/4 at the bilateral biceps, triceps, brachioradialis, patella and 1/4 at the bilateral achilles. Plantar responses are downgoing bilaterally. Frontal release: positive palmomental, positive rooting reflex, positive glabellar tap  Movement examination: Tone: There is increased (moderate)  tone in the bilateral upper extremities.  The tone in the lower extremities is mildly increased.  Abnormal movements: slight resting tremor but also tremor with activation bilaterally Coordination:  There is decremation with RAM's, in the bilateral UE/LE but there is also apraxia when asked to perform commands of coordination Gait and Station: The patient has no difficulty arising out of a deep-seated chair without the use of the hands. The patient's stride length is decreased with marked decrease in arm swing.    ASSESSMENT/PLAN:  1.  Parkinsonism  -Likely secondary to Haldol.  I talked to  the patient and her daughter about the fact that it would be impossible to tell if this is idiopathic Parkinson's disease while she is on Haldol, but I suspect that this is due to D2 receptor blockade from the Haldol.  Even if this medication is discontinued, it will take up to 6 months to know if the symptoms get better.  If she needs an antipsychotic medication, then perhaps Seroquel would be a better choice.  Seroquel does have some D2 receptor blockade, but it is not as strong as Haldol.  I will certainly leave this up to her prescribing physician. 2.  advanced dementia.  -I. had a long discussion about safety with the patient and her daughter.  I really do not think that she should be left alone, even at night.  She needs 24-hour per day care.  Greater than 50% of this 60 minute visit in counseling. 3.  followup as needed.

## 2014-01-25 ENCOUNTER — Other Ambulatory Visit: Payer: Self-pay | Admitting: *Deleted

## 2014-01-25 MED ORDER — LEVOTHYROXINE SODIUM 50 MCG PO TABS: 50.0000 ug | ORAL_TABLET | Freq: Every day | ORAL | Status: DC

## 2014-05-02 ENCOUNTER — Emergency Department (HOSPITAL_COMMUNITY): Payer: Medicare Other

## 2014-05-02 ENCOUNTER — Observation Stay (HOSPITAL_COMMUNITY)
Admission: EM | Admit: 2014-05-02 | Discharge: 2014-05-03 | Disposition: A | Discharge status: Home / Self Care | Payer: Medicare Other | Attending: Internal Medicine | Admitting: Internal Medicine

## 2014-05-02 ENCOUNTER — Encounter (HOSPITAL_COMMUNITY): Payer: Self-pay | Admitting: Emergency Medicine

## 2014-05-02 DIAGNOSIS — G20 Parkinson's disease: Secondary | ICD-10-CM | POA: Insufficient documentation

## 2014-05-02 DIAGNOSIS — Z79899 Other long term (current) drug therapy: Secondary | ICD-10-CM | POA: Diagnosis not present

## 2014-05-02 DIAGNOSIS — T68XXXA Hypothermia, initial encounter: Secondary | ICD-10-CM

## 2014-05-02 DIAGNOSIS — E890 Postprocedural hypothyroidism: Secondary | ICD-10-CM | POA: Insufficient documentation

## 2014-05-02 DIAGNOSIS — Z7982 Long term (current) use of aspirin: Secondary | ICD-10-CM | POA: Diagnosis not present

## 2014-05-02 DIAGNOSIS — E876 Hypokalemia: Secondary | ICD-10-CM | POA: Insufficient documentation

## 2014-05-02 DIAGNOSIS — R55 Syncope and collapse: Secondary | ICD-10-CM | POA: Insufficient documentation

## 2014-05-02 DIAGNOSIS — Z880 Allergy status to penicillin: Secondary | ICD-10-CM | POA: Insufficient documentation

## 2014-05-02 DIAGNOSIS — E079 Disorder of thyroid, unspecified: Secondary | ICD-10-CM | POA: Diagnosis not present

## 2014-05-02 DIAGNOSIS — I959 Hypotension, unspecified: Secondary | ICD-10-CM | POA: Diagnosis not present

## 2014-05-02 DIAGNOSIS — Z888 Allergy status to other drugs, medicaments and biological substances status: Secondary | ICD-10-CM | POA: Insufficient documentation

## 2014-05-02 DIAGNOSIS — I9589 Other hypotension: Secondary | ICD-10-CM

## 2014-05-02 DIAGNOSIS — R531 Weakness: Secondary | ICD-10-CM | POA: Insufficient documentation

## 2014-05-02 DIAGNOSIS — G212 Secondary parkinsonism due to other external agents: Secondary | ICD-10-CM

## 2014-05-02 DIAGNOSIS — R4182 Altered mental status, unspecified: Principal | ICD-10-CM | POA: Insufficient documentation

## 2014-05-02 DIAGNOSIS — F0390 Unspecified dementia without behavioral disturbance: Secondary | ICD-10-CM | POA: Diagnosis present

## 2014-05-02 LAB — COMPREHENSIVE METABOLIC PANEL
ALK PHOS: 60 U/L (ref 39–117)
ALT: 10 U/L (ref 0–35)
AST: 18 U/L (ref 0–37)
Albumin: 3.7 g/dL (ref 3.5–5.2)
Anion gap: 15 (ref 5–15)
BUN: 16 mg/dL (ref 6–23)
CO2: 23 mEq/L (ref 19–32)
Calcium: 8.8 mg/dL (ref 8.4–10.5)
Chloride: 102 mEq/L (ref 96–112)
Creatinine, Ser: 1.21 mg/dL — ABNORMAL HIGH (ref 0.50–1.10)
GFR calc Af Amer: 46 mL/min — ABNORMAL LOW (ref >90)
GFR calc non Af Amer: 40 mL/min — ABNORMAL LOW (ref >90)
GLUCOSE: 87 mg/dL (ref 70–99)
POTASSIUM: 4.2 meq/L (ref 3.7–5.3)
SODIUM: 140 meq/L (ref 137–147)
TOTAL PROTEIN: 6.8 g/dL (ref 6.0–8.3)
Total Bilirubin: 0.3 mg/dL (ref 0.3–1.2)

## 2014-05-02 LAB — CBG MONITORING, ED: Glucose-Capillary: 80 mg/dL (ref 70–99)

## 2014-05-02 LAB — URINALYSIS, ROUTINE W REFLEX MICROSCOPIC
Bilirubin Urine: NEGATIVE
GLUCOSE, UA: NEGATIVE mg/dL
Hgb urine dipstick: NEGATIVE
Ketones, ur: NEGATIVE mg/dL
LEUKOCYTES UA: NEGATIVE
NITRITE: NEGATIVE
PH: 7.5 (ref 5.0–8.0)
Protein, ur: NEGATIVE mg/dL
SPECIFIC GRAVITY, URINE: 1.012 (ref 1.005–1.030)
Urobilinogen, UA: 0.2 mg/dL (ref 0.0–1.0)

## 2014-05-02 LAB — TROPONIN I
Troponin I: <0.3 ng/mL (ref <0.30)
Troponin I: <0.3 ng/mL (ref <0.30)

## 2014-05-02 LAB — CBC
HCT: 41.3 % (ref 36.0–46.0)
HEMOGLOBIN: 13.9 g/dL (ref 12.0–15.0)
MCH: 30 pg (ref 26.0–34.0)
MCHC: 33.7 g/dL (ref 30.0–36.0)
MCV: 89.2 fL (ref 78.0–100.0)
Platelets: 212 10*3/uL (ref 150–400)
RBC: 4.63 MIL/uL (ref 3.87–5.11)
RDW: 12.8 % (ref 11.5–15.5)
WBC: 8.6 10*3/uL (ref 4.0–10.5)

## 2014-05-02 LAB — PROTIME-INR
INR: 1.01 (ref 0.00–1.49)
Prothrombin Time: 13.4 seconds (ref 11.6–15.2)

## 2014-05-02 LAB — I-STAT TROPONIN, ED: TROPONIN I, POC: 0.01 ng/mL (ref 0.00–0.08)

## 2014-05-02 LAB — TSH: TSH: 4.19 u[IU]/mL (ref 0.350–4.500)

## 2014-05-02 LAB — LACTIC ACID, PLASMA: Lactic Acid, Venous: 2.5 mmol/L — ABNORMAL HIGH (ref 0.5–2.2)

## 2014-05-02 MED ORDER — ACETAMINOPHEN 325 MG PO TABS: 650.0000 mg | ORAL_TABLET | Freq: Four times a day (QID) | ORAL | Status: DC | PRN

## 2014-05-02 MED ORDER — ASPIRIN 325 MG PO TABS
325.0000 mg | ORAL_TABLET | Freq: Every day | ORAL | Status: DC
  Administered 2014-05-03: 325 mg via ORAL
  Filled 2014-05-02 (×2): qty 1

## 2014-05-02 MED ORDER — INFLUENZA VAC SPLIT QUAD 0.5 ML IM SUSY
0.5000 mL | PREFILLED_SYRINGE | INTRAMUSCULAR | Status: DC
  Filled 2014-05-02: qty 0.5

## 2014-05-02 MED ORDER — SODIUM CHLORIDE 0.9 % IJ SOLN: 3.0000 mL | Freq: Two times a day (BID) | INTRAMUSCULAR | Status: DC

## 2014-05-02 MED ORDER — CHLORDIAZEPOXIDE HCL 5 MG PO CAPS
5.0000 mg | ORAL_CAPSULE | Freq: Every day | ORAL | Status: DC
  Administered 2014-05-02: 5 mg via ORAL
  Filled 2014-05-02: qty 1

## 2014-05-02 MED ORDER — HYDROCODONE-ACETAMINOPHEN 5-325 MG PO TABS: 1.0000 | ORAL_TABLET | ORAL | Status: DC | PRN

## 2014-05-02 MED ORDER — SODIUM CHLORIDE 0.9 % IV SOLN
INTRAVENOUS | Status: DC
  Administered 2014-05-02 – 2014-05-03 (×2): via INTRAVENOUS

## 2014-05-02 MED ORDER — HEPARIN SODIUM (PORCINE) 5000 UNIT/ML IJ SOLN
5000.0000 [IU] | Freq: Three times a day (TID) | INTRAMUSCULAR | Status: DC
  Administered 2014-05-02 – 2014-05-03 (×2): 5000 [IU] via SUBCUTANEOUS
  Filled 2014-05-02 (×5): qty 1

## 2014-05-02 MED ORDER — ACETAMINOPHEN 650 MG RE SUPP: 650.0000 mg | Freq: Four times a day (QID) | RECTAL | Status: DC | PRN

## 2014-05-02 MED ORDER — VITAMIN B-12 1000 MCG PO TABS
5000.0000 ug | ORAL_TABLET | Freq: Every day | ORAL | Status: DC
  Administered 2014-05-03: 5000 ug via ORAL
  Filled 2014-05-02 (×2): qty 5

## 2014-05-02 MED ORDER — LEVOTHYROXINE SODIUM 50 MCG PO TABS
50.0000 ug | ORAL_TABLET | Freq: Every day | ORAL | Status: DC
  Administered 2014-05-03: 50 ug via ORAL
  Filled 2014-05-02 (×2): qty 1

## 2014-05-02 NOTE — ED Notes (Signed)
Per EMS: from [place], [place] area, was at church and had a episode of weakness where she almost collapsed but was caught and lowered to pew by another church member.  Hx of dementia and thyroid problems, did not actually fall. Family states she has been altered this morning, their stroke scale was negative.  CBG 123.  Initial BP at scene was 70 palpated, was 138/70 BP. 18 gauge r ac.

## 2014-05-02 NOTE — ED Notes (Signed)
Phlebotomy at bedside.

## 2014-05-02 NOTE — ED Notes (Signed)
Patient transported to CT 

## 2014-05-02 NOTE — ED Notes (Signed)
Pt returned to room, placed back on monitor, family and pt updated on plan of care

## 2014-05-02 NOTE — ED Provider Notes (Signed)
CSN: [id number]     Arrival date & time 05/02/14  1121 History   First MD Initiated Contact with Patient 05/02/14 1127     Chief Complaint  Patient presents with  . Altered Mental Status      HPI Comments: Brittney Yang presents via EMS for evaluation of altered mental status and weakness. Per her report she has been confused this morning and became weak and collapsed in church. She did not fully lose consciousness. Per report she had a blood pressure of 70 systolic prior to ED arrival. She received 1 L of normal saline prior to ED arrival by EMS with improvement in her blood pressure.  Level V Caveat.  Patient is a 80 y.o. female presenting with altered mental status. The history is provided by the EMS personnel. History limited by: dementia and confusion.  Altered Mental Status   Past Medical History  Diagnosis Date  . Thyroid disease   . Tremor   . Dementia    Past Surgical History  Procedure Laterality Date  . Thyroid surgery  1960   No family history on file. History  Substance Use Topics  . Smoking status: Never Smoker   . Smokeless tobacco: Never Used  . Alcohol Use: No   OB History    No data available     Review of Systems  Unable to perform ROS     Allergies  Methimazole and Penicillins  Home Medications   Prior to Admission medications   Medication Sig Start Date End Date Taking? Authorizing Provider  aspirin 325 MG tablet Take 325 mg by mouth daily.    Historical Provider, MD  chlordiazePOXIDE (LIBRIUM) 5 MG capsule Take 5 mg by mouth at bedtime.     Historical Provider, MD  cyanocobalamin 2000 MCG tablet Take 2,000 mcg by mouth daily.    Historical Provider, MD  donepezil (ARICEPT) 10 MG tablet Take 10 mg by mouth at bedtime as needed.    Historical Provider, MD  haloperidol (HALDOL) 0.5 MG tablet Take 0.5 mg by mouth 2 (two) times daily.    Historical Provider, MD  levothyroxine (SYNTHROID, LEVOTHROID) 50 MCG tablet Take 1 tablet (50 mcg total) by  mouth daily before breakfast. 01/25/14   Reather LittlerAjay Kumar, MD   BP 93/65 mmHg  Pulse 73  Resp 19  Ht 5\' 4"  (1.626 m)  Wt 107 lb (48.535 kg)  BMI 18.36 kg/m2  SpO2 100% Physical Exam  Constitutional: She appears well-developed and well-nourished.  Mild distress  HENT:  Head: Normocephalic and atraumatic.  Eyes: Pupils are equal, round, and reactive to light.  Cardiovascular: Normal rate and regular rhythm.   Pulmonary/Chest: Effort normal. No respiratory distress.  Decreased breath sounds in bilateral bases  Abdominal: Soft. She exhibits distension. There is no tenderness. There is no rebound and no guarding.  Musculoskeletal: She exhibits no edema or tenderness.  Neurological: She is alert.  Disoriented to place and time. No facial asymmetry.  Five out of 5 grip strength in bilateral upper extremities, 5 out of 5 strength in bilateral lower extremities. Disoriented to place and time.  Skin: Skin is warm and dry.  Psychiatric: She has a normal mood and affect.  Nursing note and vitals reviewed.   ED Course  Procedures (including critical care time) CRITICAL CARE Performed by: Tilden FossaEES, Alick Lecomte   Total critical care time:30 minutes  Critical care time was exclusive of separately billable procedures and treating other patients.  Critical care was necessary to treat or prevent imminent  or life-threatening deterioration.  Critical care was time spent personally by me on the following activities: development of treatment plan with patient and/or surrogate as well as nursing, discussions with consultants, evaluation of patient's response to treatment, examination of patient, obtaining history from patient or surrogate, ordering and performing treatments and interventions, ordering and review of laboratory studies, ordering and review of radiographic studies, pulse oximetry and re-evaluation of patient's condition.  Labs Review Labs Reviewed  COMPREHENSIVE METABOLIC PANEL - Abnormal;  Notable for the following:    Creatinine, Ser 1.21 (*)    GFR calc non Af Amer 40 (*)    GFR calc Af Amer 46 (*)    All other components within normal limits  LACTIC ACID, PLASMA - Abnormal; Notable for the following:    Lactic Acid, Venous 2.5 (*)    All other components within normal limits  CULTURE, BLOOD (ROUTINE X 2)  CULTURE, BLOOD (ROUTINE X 2)  CBC  PROTIME-INR  URINALYSIS, ROUTINE W REFLEX MICROSCOPIC  CBG MONITORING, ED  CBG MONITORING, ED  Rosezena SensorI-STAT TROPOININ, ED    Imaging Review Dg Chest 2 View  05/02/2014   CLINICAL DATA:  Weakness and confusion. Initial encounter. Altered mental status.  EXAM: CHEST  2 VIEW  COMPARISON:  None.  FINDINGS: Cardiopericardial silhouette within normal limits. Mediastinal contours normal. Trachea midline. No airspace disease or effusion. Mild tortuosity of the thoracic aorta.  IMPRESSION: No active cardiopulmonary disease.   Electronically Signed   By: Andreas NewportGeoffrey  Lamke M.D.   On: 05/02/2014 14:36   Ct Head Wo Contrast  05/02/2014   CLINICAL DATA:  Altered mental status, weakness, dementia  EXAM: CT HEAD WITHOUT CONTRAST  TECHNIQUE: Contiguous axial images were obtained from the base of the skull through the vertex without contrast.  COMPARISON:  11/22/2010  FINDINGS: Stable brain atrophy and minor periventricular chronic white matter ischemic changes throughout both cerebral hemispheres. No acute intracranial hemorrhage, infarction, mass lesion, midline shift, herniation, hydrocephalus, or extra-axial fluid collection. Normal gray-white matter differentiation. Cisterns are patent. Cerebellar atrophy as well. Mastoids and sinuses are clear.  IMPRESSION: Stable atrophy and chronic white matter microvascular ischemic changes.   Electronically Signed   By: Ruel Favorsrevor  Shick M.D.   On: 05/02/2014 14:22     EKG Interpretation   Date/Time:  Sunday May 02 2014 11:42:24 EST Ventricular Rate:  69 PR Interval:  208 QRS Duration: 83 QT Interval:   434 QTC Calculation: 465 R Axis:   40 Text Interpretation:  Sinus rhythm Atrial premature complex Anteroseptal  infarct, age indeterminate Confirmed by Lincoln Brighamees, Liz [phone]([id number]) on 05/02/2014  4:18:01 PM     After patient's emergency department arrival family came in to provide additional history. Patient lives alone at home, and has had no complaints of feeling ill recently. She has baseline dementia which leaves her with short-term memory loss. She was well this morning and went to church without difficulty. While singing in the choir stand she began to get confused, weak and started to fall to the ground but was caught before hitting the ground. There was no loss of consciousness. She did have prolonged confusion.  MDM   Final diagnoses:  Weakness  Near syncope  Other specified hypotension    Patient was brought in by EMS for evaluation of near syncope with hypotension. Patient required 2 L of normal saline in the emergency department to improve her blood pressure to 100 systolic. There is no clear evidence of infection at this time, but blood cultures were sent.  Patient does seem to be mildly confused but improved from presentation after improvement in her blood pressure. Discussed with hospitalist regarding admission for hypotension with near-syncope, unclear etiology of hypotension.    Tilden Fossa, MD 05/02/14 1622

## 2014-05-02 NOTE — Plan of Care (Signed)
Problem: Consults Goal: General Medical Patient Education See Patient Education Module for specific education. Outcome: Completed/Met Date Met:  05/02/14 Goal: Diabetes Guidelines if Diabetic/Glucose > 140 If diabetic or lab glucose is > 140 mg/dl - Initiate Diabetes/Hyperglycemia Guidelines & Document Interventions  Outcome: Not Applicable Date Met:  99/67/22

## 2014-05-02 NOTE — H&P (Signed)
[facility] History and Physical  KAYAL MULA ZOX:[id number] DOB: [birth year] DOA: 05/02/2014  Referring physician: Dr. Madilyn Hook PCP: Pamelia Hoit, MD   Chief Complaint: syncope  HPI: Brittney Yang is a 80 y.o. female with past medical history of dementia, tremor and hypothyroidism, brought to the hospital after she collapsed earlier today and her church. Patient is demented lady lives alone, who is needed assistance for most of her ADLs which provided by her daughter and son, who lives around. Patient went to her chart today and while she was singing in the choir she was about to fall but one of the churchgoers catches her, but reports she never lost her consciousness. Reportedly EMS called and upon arrival her SBP was 70,improved after 1 L to 86. Patient received 2 L of fluids in the ED and blood pressure now in the 140s. In the ED blood work appears very benign, slightly elevated lactic acid of 2.5 which is likely secondary to earlier hypotension, CT of the head, chest x-ray and urinalysis were all clear. Patient temperature initially was 96.3, patient plays harder and later temperature improved to 97. Patient is demented worse not able to provide any history, the history was provided from her daughter Lynden Ang at bedside.  Review of Systems:  Unable to provide review of system.  Past Medical History  Diagnosis Date  . Thyroid disease   . Tremor   . Dementia    Past Surgical History  Procedure Laterality Date  . Thyroid surgery  1960   Social History:   reports that she has never smoked. She has never used smokeless tobacco. She reports that she does not drink alcohol or use illicit drugs.  Allergies  Allergen Reactions  . Methimazole [Thiamazole] Anaphylaxis  . Penicillins Hives    No family history on file.   Prior to Admission medications   Medication Sig Start Date End Date Taking? Authorizing Provider  aspirin 325 MG tablet Take 325 mg by mouth daily.   Yes  Historical Provider, MD  donepezil (ARICEPT) 10 MG tablet Take 10 mg by mouth at bedtime as needed.   Yes Historical Provider, MD  levothyroxine (SYNTHROID, LEVOTHROID) 50 MCG tablet Take 1 tablet (50 mcg total) by mouth daily before breakfast. 01/25/14  Yes Reather Littler, MD  vitamin B-12 (CYANOCOBALAMIN) 1000 MCG tablet Take 5,000 mcg by mouth daily.   Yes Historical Provider, MD  chlordiazePOXIDE (LIBRIUM) 5 MG capsule Take 5 mg by mouth at bedtime.     Historical Provider, MD  haloperidol (HALDOL) 0.5 MG tablet Take 0.5 mg by mouth 2 (two) times daily.    Historical Provider, MD   Physical Exam: Filed Vitals:   05/02/14 1600  BP: 144/70  Pulse: 74  Temp:   Resp: 27   Constitutional: disoriented. Well-developed and well-nourished. Cooperative.  Head: Normocephalic and atraumatic.  Nose: Nose normal.  Mouth/Throat: Uvula is midline, oropharynx is clear and moist and mucous membranes are normal.  Eyes: Conjunctivae and EOM are normal. Pupils are equal, round, and reactive to light.  Neck: Trachea normal and normal range of motion. Neck supple.  Cardiovascular: Normal rate, regular rhythm, S1 normal, S2 normal, normal heart sounds and intact distal pulses.   Pulmonary/Chest: Effort normal and breath sounds normal.  Abdominal: Soft. Bowel sounds are normal. There is no hepatosplenomegaly. There is no tenderness.  Musculoskeletal: Normal range of motion.  Neurological: disoriented. Has normal strength. No cranial nerve deficit or sensory deficit.  Skin: Skin is warm, dry and intact.  Psychiatric: Has a normal mood and affect. Speech is normal and behavior is normal.   Labs on Admission:  Basic Metabolic Panel:  Recent Labs Lab 05/02/14 1134  NA 140  K 4.2  CL 102  CO2 23  GLUCOSE 87  BUN 16  CREATININE 1.21*  CALCIUM 8.8   Liver Function Tests:  Recent Labs Lab 05/02/14 1134  AST 18  ALT 10  ALKPHOS 60  BILITOT 0.3  PROT 6.8  ALBUMIN 3.7   No results for input(s):  LIPASE, AMYLASE in the last 168 hours. No results for input(s): AMMONIA in the last 168 hours. CBC:  Recent Labs Lab 05/02/14 1134  WBC 8.6  HGB 13.9  HCT 41.3  MCV 89.2  PLT 212   Cardiac Enzymes: No results for input(s): CKTOTAL, CKMB, CKMBINDEX, TROPONINI in the last 168 hours.  BNP (last 3 results) No results for input(s): PROBNP in the last 8760 hours. CBG:  Recent Labs Lab 05/02/14 1123  GLUCAP 80    Radiological Exams on Admission: Dg Chest 2 View  05/02/2014   CLINICAL DATA:  Weakness and confusion. Initial encounter. Altered mental status.  EXAM: CHEST  2 VIEW  COMPARISON:  None.  FINDINGS: Cardiopericardial silhouette within normal limits. Mediastinal contours normal. Trachea midline. No airspace disease or effusion. Mild tortuosity of the thoracic aorta.  IMPRESSION: No active cardiopulmonary disease.   Electronically Signed   By: Andreas NewportGeoffrey  Lamke M.D.   On: 05/02/2014 14:36   Ct Head Wo Contrast  05/02/2014   CLINICAL DATA:  Altered mental status, weakness, dementia  EXAM: CT HEAD WITHOUT CONTRAST  TECHNIQUE: Contiguous axial images were obtained from the base of the skull through the vertex without contrast.  COMPARISON:  11/22/2010  FINDINGS: Stable brain atrophy and minor periventricular chronic white matter ischemic changes throughout both cerebral hemispheres. No acute intracranial hemorrhage, infarction, mass lesion, midline shift, herniation, hydrocephalus, or extra-axial fluid collection. Normal gray-white matter differentiation. Cisterns are patent. Cerebellar atrophy as well. Mastoids and sinuses are clear.  IMPRESSION: Stable atrophy and chronic white matter microvascular ischemic changes.   Electronically Signed   By: Ruel Favorsrevor  Shick M.D.   On: 05/02/2014 14:22    EKG: Independently reviewed. 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 Rule out ACS by checking 3 sets of cardiac enzymes, first set and EKG were negative for ischemic changes. No lateralization symptoms, CT scan is negative, defer doing an MRI. Family reported starting Seroquel recently, Seroquel can cause orthostatic hypotension. Hydrate with IV fluids, ambulated a.m.  Hypotension Rule out ACS, no evidence of infection, no leukocytosis, clear urinalysis and clear CXR. Hydrate with IV fluids, cannot rule out volume depletion/dehydration.  Hypothermia As mentioned above no evidence of infection, blood cultures obtained. Cannot rule out exposure to cold temperature, very hyper used in the ED, current temperature is 21F.  Hypothyroidism Have had remote thyroidectomy, patient is on 50 mg of Synthroid continued. Check TSH.  Dementia Patient has moderate dementia, she is on Aricept, still lives alone at home. Daughter reported that someone with the patient at her home all the time.  Parkinsonism Patient was recently evaluated for tremors/rule out Parkinson by Dr. Arbutus Leasat. Her tremors thought to be  secondary to Haldol, discontinued and patient started recently on Seroquel. Seroquel certainly can cause orthostatic hypotension if it was started recently.  Code Status: full code Family Communication: plan discussed with the patient in the presence of her daughter Lynden AngCathy at bedside. Disposition Plan: observation, telemetry  Time spent: 70 minutes  [facility] CenterELMAHI,Esmirna Ravan A [facility] Pager [phone]

## 2014-05-02 NOTE — ED Notes (Signed)
Pt was incontinent of stool.

## 2014-05-02 NOTE — Progress Notes (Signed)
Called twice to get report, RN not available.

## 2014-05-02 NOTE — ED Notes (Signed)
bolused 1000 cc normal saline per Dr. Pecola Leisureeese verbal order

## 2014-05-03 DIAGNOSIS — R4182 Altered mental status, unspecified: Secondary | ICD-10-CM | POA: Diagnosis not present

## 2014-05-03 DIAGNOSIS — I059 Rheumatic mitral valve disease, unspecified: Secondary | ICD-10-CM

## 2014-05-03 LAB — BASIC METABOLIC PANEL
Anion gap: 12 (ref 5–15)
BUN: 14 mg/dL (ref 6–23)
CALCIUM: 8.3 mg/dL — AB (ref 8.4–10.5)
CHLORIDE: 110 meq/L (ref 96–112)
CO2: 23 mEq/L (ref 19–32)
CREATININE: 1.15 mg/dL — AB (ref 0.50–1.10)
GFR calc Af Amer: 49 mL/min — ABNORMAL LOW (ref >90)
GFR calc non Af Amer: 42 mL/min — ABNORMAL LOW (ref >90)
Glucose, Bld: 112 mg/dL — ABNORMAL HIGH (ref 70–99)
Potassium: 3.4 mEq/L — ABNORMAL LOW (ref 3.7–5.3)
Sodium: 145 mEq/L (ref 137–147)

## 2014-05-03 LAB — CBC
HEMATOCRIT: 36.7 % (ref 36.0–46.0)
Hemoglobin: 12.4 g/dL (ref 12.0–15.0)
MCH: 30.4 pg (ref 26.0–34.0)
MCHC: 33.8 g/dL (ref 30.0–36.0)
MCV: 90 fL (ref 78.0–100.0)
Platelets: 195 10*3/uL (ref 150–400)
RBC: 4.08 MIL/uL (ref 3.87–5.11)
RDW: 13.1 % (ref 11.5–15.5)
WBC: 3.6 10*3/uL — AB (ref 4.0–10.5)

## 2014-05-03 LAB — TROPONIN I

## 2014-05-03 LAB — MAGNESIUM: Magnesium: 2.1 mg/dL (ref 1.5–2.5)

## 2014-05-03 MED ORDER — POTASSIUM CHLORIDE CRYS ER 20 MEQ PO TBCR
40.0000 meq | EXTENDED_RELEASE_TABLET | Freq: Once | ORAL | Status: AC
  Administered 2014-05-03: 40 meq via ORAL
  Filled 2014-05-03: qty 2

## 2014-05-03 MED ORDER — POTASSIUM CHLORIDE 10 MEQ/100ML IV SOLN
10.0000 meq | INTRAVENOUS | Status: AC
  Administered 2014-05-03 (×2): 10 meq via INTRAVENOUS
  Filled 2014-05-03 (×2): qty 100

## 2014-05-03 MED ORDER — POTASSIUM CHLORIDE IN NACL 20-0.9 MEQ/L-% IV SOLN
INTRAVENOUS | Status: DC
  Administered 2014-05-03: 10:00:00 via INTRAVENOUS
  Filled 2014-05-03 (×2): qty 1000

## 2014-05-03 NOTE — Discharge Summary (Addendum)
Physician Discharge Touchet MRN: [id number] DOB/AGE: 1927-12-24 80 y.o.  PCP: Woody Seller, MD .  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     chlordiazePOXIDE 5 MG capsule  Commonly known as:  LIBRIUM  Take 5 mg by mouth at bedtime.     donepezil 10 MG tablet  Commonly known as:  ARICEPT  Take 10 mg by mouth every morning.     levothyroxine 50 MCG tablet  Commonly known as:  SYNTHROID, LEVOTHROID  Take 1 tablet (50 mcg total) by mouth daily before breakfast.     QUEtiapine 25 MG tablet  Commonly known as:  SEROQUEL  Take 25 mg by mouth 2 (two) times daily.     vitamin B-12 1000 MCG tablet  Commonly known as:  CYANOCOBALAMIN  Take 5,000 mcg by mouth daily.        Discharge Condition: stable  Disposition: 06-Home-Health Care Svc   Consults: none   Significant Diagnostic Studies: Dg Chest 2 View  05/02/2014   CLINICAL DATA:  Weakness and confusion. Initial encounter. Altered mental status.  EXAM: CHEST  2 VIEW  COMPARISON:  None.  FINDINGS: Cardiopericardial silhouette within normal limits. Mediastinal contours normal. Trachea midline. No airspace disease or effusion. Mild tortuosity of the thoracic aorta.  IMPRESSION: No active cardiopulmonary disease.   Electronically Signed   By: Dereck Ligas M.D.   On: 05/02/2014 14:36   Ct Head Wo Contrast  05/02/2014   CLINICAL DATA:  Altered mental status, weakness, dementia  EXAM: CT HEAD WITHOUT CONTRAST   TECHNIQUE: Contiguous axial images were obtained from the base of the skull through the vertex without contrast.  COMPARISON:  11/22/2010  FINDINGS: Stable brain atrophy and minor periventricular chronic white matter ischemic changes throughout both cerebral hemispheres. No acute intracranial hemorrhage, infarction, mass lesion, midline shift, herniation, hydrocephalus, or extra-axial fluid collection. Normal gray-white matter differentiation. Cisterns are patent. Cerebellar atrophy as well. Mastoids and sinuses are clear.  IMPRESSION: Stable atrophy and chronic white matter microvascular ischemic changes.   Electronically Signed   By: Daryll Brod M.D.   On: 05/02/2014 14:22      Microbiology: Recent Results (from the past 240 hour(s))  Blood culture (routine x 2)     Status: None (Preliminary result)   Collection Time: 05/02/14 11:49 AM  Result Value Ref Range Status   Specimen Description BLOOD LEFT ARM  Final   Special Requests BOTTLES DRAWN AEROBIC AND ANAEROBIC 5CC  Final   Culture  Setup Time   Final    05/02/2014 19:34 Performed at [facility]    Culture   Final           BLOOD CULTURE RECEIVED NO GROWTH TO DATE CULTURE WILL BE HELD FOR 5 DAYS BEFORE ISSUING A FINAL NEGATIVE REPORT Performed at [facility]    Report Status PENDING  Incomplete  Blood culture (routine x 2)     Status:  None (Preliminary result)   Collection Time: 05/02/14 11:54 AM  Result Value Ref Range Status   Specimen Description BLOOD RIGHT HAND  Final   Special Requests BOTTLES DRAWN AEROBIC ONLY 2CC  Final   Culture  Setup Time   Final    05/02/2014 19:21 Performed at [facility]    Culture   Final           BLOOD CULTURE RECEIVED NO GROWTH TO DATE CULTURE WILL BE HELD FOR 5 DAYS BEFORE ISSUING A FINAL NEGATIVE REPORT Performed at [facility]    Report Status PENDING  Incomplete     Labs: Results for orders placed or performed during the hospital encounter of  05/02/14 (from the past 48 hour(s))  CBG monitoring, ED     Status: None   Collection Time: 05/02/14 11:23 AM  Result Value Ref Range   Glucose-Capillary 80 70 - 99 mg/dL  CBC     Status: None   Collection Time: 05/02/14 11:34 AM  Result Value Ref Range   WBC 8.6 4.0 - 10.5 K/uL   RBC 4.63 3.87 - 5.11 MIL/uL   Hemoglobin 13.9 12.0 - 15.0 g/dL   HCT 41.3 36.0 - 46.0 %   MCV 89.2 78.0 - 100.0 fL   MCH 30.0 26.0 - 34.0 pg   MCHC 33.7 30.0 - 36.0 g/dL   RDW 12.8 11.5 - 15.5 %   Platelets 212 150 - 400 K/uL  Comprehensive metabolic panel     Status: Abnormal   Collection Time: 05/02/14 11:34 AM  Result Value Ref Range   Sodium 140 137 - 147 mEq/L   Potassium 4.2 3.7 - 5.3 mEq/L   Chloride 102 96 - 112 mEq/L   CO2 23 19 - 32 mEq/L   Glucose, Bld 87 70 - 99 mg/dL   BUN 16 6 - 23 mg/dL   Creatinine, Ser 1.21 (H) 0.50 - 1.10 mg/dL   Calcium 8.8 8.4 - 10.5 mg/dL   Total Protein 6.8 6.0 - 8.3 g/dL   Albumin 3.7 3.5 - 5.2 g/dL   AST 18 0 - 37 U/L   ALT 10 0 - 35 U/L   Alkaline Phosphatase 60 39 - 117 U/L   Total Bilirubin 0.3 0.3 - 1.2 mg/dL   GFR calc non Af Amer 40 (L) >90 mL/min   GFR calc Af Amer 46 (L) >90 mL/min    Comment: (NOTE) The eGFR has been calculated using the CKD EPI equation. This calculation has not been validated in all clinical situations. eGFR's persistently <90 mL/min signify possible Chronic Kidney Disease.    Anion gap 15 5 - 15  Protime-INR (if patient is on an anticoagulant)     Status: None   Collection Time: 05/02/14 11:34 AM  Result Value Ref Range   Prothrombin Time 13.4 11.6 - 15.2 seconds   INR 1.01 0.00 - 1.49  Lactic acid, plasma     Status: Abnormal   Collection Time: 05/02/14 11:35 AM  Result Value Ref Range   Lactic Acid, Venous 2.5 (H) 0.5 - 2.2 mmol/L  Urinalysis, Routine w reflex microscopic     Status: None   Collection Time: 05/02/14 11:41 AM  Result Value Ref Range   Color, Urine YELLOW YELLOW   APPearance CLEAR CLEAR   Specific  Gravity, Urine 1.012 1.005 - 1.030   pH 7.5 5.0 - 8.0   Glucose, UA NEGATIVE NEGATIVE mg/dL   Hgb urine dipstick NEGATIVE NEGATIVE   Bilirubin Urine NEGATIVE  NEGATIVE   Ketones, ur NEGATIVE NEGATIVE mg/dL   Protein, ur NEGATIVE NEGATIVE mg/dL   Urobilinogen, UA 0.2 0.0 - 1.0 mg/dL   Nitrite NEGATIVE NEGATIVE   Leukocytes, UA NEGATIVE NEGATIVE    Comment: MICROSCOPIC NOT DONE ON URINES WITH NEGATIVE PROTEIN, BLOOD, LEUKOCYTES, NITRITE, OR GLUCOSE <1000 mg/dL.  Blood culture (routine x 2)     Status: None (Preliminary result)   Collection Time: 05/02/14 11:49 AM  Result Value Ref Range   Specimen Description BLOOD LEFT ARM    Special Requests BOTTLES DRAWN AEROBIC AND ANAEROBIC 5CC    Culture  Setup Time      05/02/2014 19:34 Performed at [facility]    Culture             BLOOD CULTURE RECEIVED NO GROWTH TO DATE CULTURE WILL BE HELD FOR 5 DAYS BEFORE ISSUING A FINAL NEGATIVE REPORT Performed at [facility]    Report Status PENDING   Blood culture (routine x 2)     Status: None (Preliminary result)   Collection Time: 05/02/14 11:54 AM  Result Value Ref Range   Specimen Description BLOOD RIGHT HAND    Special Requests BOTTLES DRAWN AEROBIC ONLY 2CC    Culture  Setup Time      05/02/2014 19:21 Performed at [place] NO GROWTH TO DATE CULTURE WILL BE HELD FOR 5 DAYS BEFORE ISSUING A FINAL NEGATIVE REPORT Performed at [facility]    Report Status PENDING   I-stat troponin, ED     Status: None   Collection Time: 05/02/14 12:21 PM  Result Value Ref Range   Troponin i, poc 0.01 0.00 - 0.08 ng/mL   Comment 3            Comment: Due to the release kinetics of cTnI, a negative result within the first hours of the onset of symptoms does not rule out myocardial infarction with certainty. If myocardial infarction is still suspected, repeat the test at appropriate intervals.   TSH     Status: None    Collection Time: 05/02/14  6:17 PM  Result Value Ref Range   TSH 4.190 0.350 - 4.500 uIU/mL  Troponin I     Status: None   Collection Time: 05/02/14  6:17 PM  Result Value Ref Range   Troponin I <0.30 <0.30 ng/mL    Comment:        Due to the release kinetics of cTnI, a negative result within the first hours of the onset of symptoms does not rule out myocardial infarction with certainty. If myocardial infarction is still suspected, repeat the test at appropriate intervals.   Troponin I     Status: None   Collection Time: 05/02/14 11:00 PM  Result Value Ref Range   Troponin I <0.30 <0.30 ng/mL    Comment:        Due to the release kinetics of cTnI, a negative result within the first hours of the onset of symptoms does not rule out myocardial infarction with certainty. If myocardial infarction is still suspected, repeat the test at appropriate intervals.   CBC     Status: Abnormal   Collection Time: 05/03/14  3:25 AM  Result Value Ref Range   WBC 3.6 (L) 4.0 - 10.5 K/uL   RBC 4.08 3.87 - 5.11 MIL/uL   Hemoglobin 12.4 12.0 - 15.0 g/dL  HCT 36.7 36.0 - 46.0 %   MCV 90.0 78.0 - 100.0 fL   MCH 30.4 26.0 - 34.0 pg   MCHC 33.8 30.0 - 36.0 g/dL   RDW 13.1 11.5 - 15.5 %   Platelets 195 150 - 400 K/uL  Basic metabolic panel     Status: Abnormal   Collection Time: 05/03/14  3:25 AM  Result Value Ref Range   Sodium 145 137 - 147 mEq/L   Potassium 3.4 (L) 3.7 - 5.3 mEq/L    Comment: DELTA CHECK NOTED   Chloride 110 96 - 112 mEq/L   CO2 23 19 - 32 mEq/L   Glucose, Bld 112 (H) 70 - 99 mg/dL   BUN 14 6 - 23 mg/dL   Creatinine, Ser 1.15 (H) 0.50 - 1.10 mg/dL   Calcium 8.3 (L) 8.4 - 10.5 mg/dL   GFR calc non Af Amer 42 (L) >90 mL/min   GFR calc Af Amer 49 (L) >90 mL/min    Comment: (NOTE) The eGFR has been calculated using the CKD EPI equation. This calculation has not been validated in all clinical situations. eGFR's persistently <90 mL/min signify possible Chronic  Kidney Disease.    Anion gap 12 5 - 15  Troponin I     Status: None   Collection Time: 05/03/14  3:25 AM  Result Value Ref Range   Troponin I <0.30 <0.30 ng/mL    Comment:        Due to the release kinetics of cTnI, a negative result within the first hours of the onset of symptoms does not rule out myocardial infarction with certainty. If myocardial infarction is still suspected, repeat the test at appropriate intervals.   Magnesium     Status: None   Collection Time: 05/03/14  3:25 AM  Result Value Ref Range   Magnesium 2.1 1.5 - 2.5 mg/dL     HPI :Brittney Yang is a 80 y.o. female with past medical history of dementia, tremor and hypothyroidism, brought to the hospital after she collapsed earlier today and her church. Patient is demented lady lives alone, who is needed assistance for most of her ADLs which provided by her daughter and son, who lives around. Patient went to her chart today and while she was singing in the choir she was about to fall but one of the churchgoers catches her, but reports she never lost her consciousness. Reportedly EMS called and upon arrival her SBP was 70,improved after 1 L to 86. Patient received 2 L of fluids in the ED and blood pressure now in the 140s. In the ED blood work appears very benign, slightly elevated lactic acid of 2.5 which is likely secondary to earlier hypotension, CT of the head, chest x-ray and urinalysis were all clear. Patient temperature initially was 96.3, patient plays harder and later temperature improved to 97. Patient is demented worse not able to provide any history, the history was provided from her daughter Tye [place] at bedside. As per family this was an isolated event   HOSPITAL COURSE: * Near syncope Unclear etiology, orthostatics negative, no history of intercurrent illness Cardiac enzymes cycled and were found to be negative EKG showed normal sinus rhythm with PACs CT of the head was negative No lateralization  symptoms,  , defer doing an MRI. Recently switched from Haldol to seroquel , patient has actually felt a lot better since this transition was made Patient hydrated with IV fluids due to elevated lactic acid No signs of infection Will obtain  a 2-D echo and anticipate discharge today after PT evaluation PCP to follow-up on the results of the 2-D echo If symptoms recur, she may need an event monitor  Hypotension no evidence of infection, no leukocytosis, clear urinalysis and clear CXR. Significantly improved after hydration with IV fluids   Hypothermia As mentioned above no evidence of infection, blood cultures obtained., no growth so far, PCP to follow-up on the results of blood culture Cannot rule out exposure to cold temperature,   current temperature is 16F.  Hypothyroidism Have had remote thyroidectomy, patient is on 50 mg of Synthroid continued. TSH within normal limits   Dementia Patient has moderate dementia, she is on Aricept, still lives alone at home.Aricept can cause bradycardia. Daughter reported that someone with the patient at her home all the time.  Parkinsonism Patient was recently evaluated for tremors/rule out Parkinson by Dr. Carles Collet. Her tremors thought to be secondary to Haldol, discontinued and patient started recently on Seroquel. Seroquel certainly can cause orthostatic hypotension if it was started recently. However the patient's tremors and agitation have improved significantly Therefore will not discontinue  Discharge Exam:    Blood pressure 128/74, pulse 74, temperature 98.6 F (37 C), temperature source Oral, resp. rate 12, height _0  (1.549 m), weight 49.6 kg (109 lb 5.6 oz), SpO2 97 %.  Head: Normocephalic and atraumatic.  Nose: Nose normal.  Mouth/Throat: Uvula is midline, oropharynx is clear and moist and mucous membranes are normal.  Eyes: Conjunctivae and EOM are normal. Pupils are equal, round, and reactive to light.  Neck: Trachea normal and  normal range of motion. Neck supple.  Cardiovascular: Normal rate, regular rhythm, S1 normal, S2 normal, normal heart sounds and intact distal pulses.  Pulmonary/Chest: Effort normal and breath sounds normal.  Abdominal: Soft. Bowel sounds are normal. There is no hepatosplenomegaly. There is no tenderness.  Musculoskeletal: Normal range of motion.  Neurological: disoriented. Has normal strength. No cranial nerve deficit or sensory deficit.          Follow-up Information    Follow up with Woody Seller, MD. Schedule an appointment as soon as possible for a visit in 1 week.   Specialty:  Family Medicine   Contact information:   [address] [place] [address] [phone]       Signed: Reyne Dumas 05/03/2014, 11:21 AM

## 2014-05-03 NOTE — Evaluation (Signed)
Physical Therapy Evaluation/ Discharge Patient Details Name: Brittney Yang MRN: [id number] DOB: 01-09-1928 Today's Date: 05/03/2014   History of Present Illness  Brittney Yang is a 80 y.o. female with past medical history of dementia, tremor and hypothyroidism, brought to the hospital after she collapsed  11/15 at her church  Clinical Impression  Pt pleasant with flat affect with son and dgtr present during eval. Family has been staying during the day and leaving pt alone at night but state they can move in and provide 24hr care. Pt with slow gait and very slow turning speed taking grossly 5 steps to complete a 180degree turn with short shuffle steps. Pt with posterior LOB with standing is at moderate risk for falls although no history of falls at this point. Recommend RW for future progression and balance  With gait and BSC for shower seat to bathe. No further therapy interventions at this time with family educated and recommendation for direct supervision at all times with mobility. Family state understanding and agreement and will sign off.     Follow Up Recommendations No PT follow up    Equipment Recommendations  Rolling walker with 5" wheels;3in1 (PT)    Recommendations for Other Services       Precautions / Restrictions Precautions Precautions: Fall      Mobility  Bed Mobility               General bed mobility comments: pt on BSC on arrival  Transfers Overall transfer level: Needs assistance   Transfers: Sit to/from Stand Sit to Stand: Min guard         General transfer comment: pt with posterior lean with guarding for balance on initial stand and x 2 during first 15 sec of standing  Ambulation/Gait Ambulation/Gait assistance: Min guard Ambulation Distance (Feet): 250 Feet Assistive device: None Gait Pattern/deviations: Step-through pattern;Decreased stride length   Gait velocity interpretation: Below normal speed for age/gender    Stairs Stairs:  Yes Stairs assistance: Min guard Stair Management: One rail Right;Forwards;Step to pattern Number of Stairs: 2    Wheelchair Mobility    Modified Rankin (Stroke Patients Only)       Balance Overall balance assessment: Needs assistance   Sitting balance-Leahy Scale: Good       Standing balance-Leahy Scale: Good                               Pertinent Vitals/Pain Pain Assessment: No/denies pain    Home Living Family/patient expects to be discharged to:: Private residence Living Arrangements: Alone Available Help at Discharge: Family;Available 24 hours/day Type of Home: House Home Access: Stairs to enter Entrance Stairs-Rails: None Entrance Stairs-Number of Steps: 2 Home Layout: One level Home Equipment: None      Prior Function Level of Independence: Needs assistance   Gait / Transfers Assistance Needed: pt was walking independently  ADL's / Homemaking Assistance Needed: assist for cooking and homemaking , dressing and sponge bathing on her own        Hand Dominance        Extremity/Trunk Assessment   Upper Extremity Assessment: Generalized weakness           Lower Extremity Assessment: Generalized weakness      Cervical / Trunk Assessment: Normal  Communication   Communication: No difficulties  Cognition Arousal/Alertness: Awake/alert Behavior During Therapy: Flat affect Overall Cognitive Status: History of cognitive impairments - at baseline  General Comments      Exercises        Assessment/Plan    PT Assessment Patent does not need any further PT services  PT Diagnosis Altered mental status   PT Problem List    PT Treatment Interventions     PT Goals (Current goals can be found in the Care Plan section) Acute Rehab PT Goals PT Goal Formulation: All assessment and education complete, DC therapy    Frequency     Barriers to discharge        Co-evaluation               End  of Session   Activity Tolerance: Patient tolerated treatment well Patient left: in chair;with call bell/phone within reach;with family/visitor present Nurse Communication: Mobility status;Precautions    Functional Assessment Tool Used: clinical judgement Functional Limitation: Mobility: Walking and moving around Mobility: Walking and Moving Around Current Status ([id number]): At least 1 percent but less than 20 percent impaired, limited or restricted Mobility: Walking and Moving Around Goal Status [phone]([id number]): At least 1 percent but less than 20 percent impaired, limited or restricted Mobility: Walking and Moving Around Discharge Status [phone]): At least 1 percent but less than 20 percent impaired, limited or restricted    Time: 1144-1201 PT Time Calculation (min) (ACUTE ONLY): 17 min   Charges:   PT Evaluation $Initial PT Evaluation Tier I: 1 Procedure     PT G Codes:   Functional Assessment Tool Used: clinical judgement Functional Limitation: Mobility: Walking and moving around    Delorse Lekabor, Tamitha Norell Beth 05/03/2014, 12:44 PM  Delaney MeigsMaija Tabor Shanon Becvar, PT [phone]

## 2014-05-03 NOTE — Progress Notes (Signed)
UR completed 

## 2014-05-03 NOTE — Progress Notes (Signed)
   Echocardiogram 2D Echocardiogram has been performed.  Brittney Yang 05/03/2014, 10:59 AM

## 2014-05-08 LAB — CULTURE, BLOOD (ROUTINE X 2)
CULTURE: NO GROWTH
Culture: NO GROWTH

## 2014-05-22 ENCOUNTER — Emergency Department (HOSPITAL_COMMUNITY): Payer: Medicare Other

## 2014-05-22 ENCOUNTER — Emergency Department (HOSPITAL_COMMUNITY)
Admission: EM | Admit: 2014-05-22 | Discharge: 2014-05-22 | Disposition: A | Discharge status: Home / Self Care | Payer: Medicare Other | Attending: Emergency Medicine | Admitting: Emergency Medicine

## 2014-05-22 ENCOUNTER — Encounter (HOSPITAL_COMMUNITY): Payer: Self-pay | Admitting: *Deleted

## 2014-05-22 DIAGNOSIS — T1490XA Injury, unspecified, initial encounter: Secondary | ICD-10-CM

## 2014-05-22 DIAGNOSIS — Z880 Allergy status to penicillin: Secondary | ICD-10-CM | POA: Insufficient documentation

## 2014-05-22 DIAGNOSIS — S52615A Nondisplaced fracture of left ulna styloid process, initial encounter for closed fracture: Secondary | ICD-10-CM | POA: Diagnosis not present

## 2014-05-22 DIAGNOSIS — Y998 Other external cause status: Secondary | ICD-10-CM | POA: Insufficient documentation

## 2014-05-22 DIAGNOSIS — S52502A Unspecified fracture of the lower end of left radius, initial encounter for closed fracture: Secondary | ICD-10-CM | POA: Diagnosis not present

## 2014-05-22 DIAGNOSIS — S6992XA Unspecified injury of left wrist, hand and finger(s), initial encounter: Secondary | ICD-10-CM | POA: Diagnosis present

## 2014-05-22 DIAGNOSIS — F0390 Unspecified dementia without behavioral disturbance: Secondary | ICD-10-CM | POA: Diagnosis not present

## 2014-05-22 DIAGNOSIS — Y9289 Other specified places as the place of occurrence of the external cause: Secondary | ICD-10-CM | POA: Insufficient documentation

## 2014-05-22 DIAGNOSIS — Y9389 Activity, other specified: Secondary | ICD-10-CM | POA: Insufficient documentation

## 2014-05-22 DIAGNOSIS — E079 Disorder of thyroid, unspecified: Secondary | ICD-10-CM | POA: Diagnosis not present

## 2014-05-22 DIAGNOSIS — Z79899 Other long term (current) drug therapy: Secondary | ICD-10-CM | POA: Diagnosis not present

## 2014-05-22 DIAGNOSIS — W010XXA Fall on same level from slipping, tripping and stumbling without subsequent striking against object, initial encounter: Secondary | ICD-10-CM | POA: Diagnosis not present

## 2014-05-22 DIAGNOSIS — Z7982 Long term (current) use of aspirin: Secondary | ICD-10-CM | POA: Diagnosis not present

## 2014-05-22 MED ORDER — HYDROCODONE-ACETAMINOPHEN 5-325 MG PO TABS: 1.0000 | ORAL_TABLET | Freq: Four times a day (QID) | ORAL | Status: DC | PRN

## 2014-05-22 NOTE — ED Provider Notes (Signed)
CSN: [id number]     Arrival date & time 05/22/14  1929 History   First MD Initiated Contact with Patient 05/22/14 2010     Chief Complaint  Patient presents with  . Fall     (Consider location/radiation/quality/duration/timing/severity/associated sxs/prior Treatment) The history is provided by the patient.  [place] Brittney Yang is a 80 y.o. female hx of dementia here with fall. She went down to get the mail and slipped on the loose gravel in the driveway and fell on the left hand. Also hit her glasses and left face. Denies head injury denies neck pain. Denies any other extremity injury. No vomiting or headaches.    Level V caveat- dementia    Past Medical History  Diagnosis Date  . Thyroid disease   . Tremor   . Dementia    Past Surgical History  Procedure Laterality Date  . Thyroid surgery  1960   No family history on file. History  Substance Use Topics  . Smoking status: Never Smoker   . Smokeless tobacco: Never Used  . Alcohol Use: No   OB History    No data available     Review of Systems  Musculoskeletal:       L wrist pain   All other systems reviewed and are negative.     Allergies  Methimazole and Penicillins  Home Medications   Prior to Admission medications   Medication Sig Start Date End Date Taking? Authorizing Provider  aspirin 325 MG tablet Take 325 mg by mouth daily.   Yes Historical Provider, MD  chlordiazePOXIDE (LIBRIUM) 5 MG capsule Take 5 mg by mouth at bedtime.    Yes Historical Provider, MD  Cyanocobalamin (B-12) 2500 MCG TABS Take 2,500 mcg by mouth daily.   Yes Historical Provider, MD  donepezil (ARICEPT) 10 MG tablet Take 10 mg by mouth every morning.    Yes Historical Provider, MD  levothyroxine (SYNTHROID, LEVOTHROID) 50 MCG tablet Take 1 tablet (50 mcg total) by mouth daily before breakfast. 01/25/14  Yes Reather LittlerAjay Kumar, MD  QUEtiapine (SEROQUEL) 25 MG tablet Take 25 mg by mouth 2 (two) times daily.   Yes Historical Provider, MD   BP  150/86 mmHg  Pulse 94  Temp(Src) 98.6 F (37 C)  Resp 18  Wt 111 lb 1 oz (50.378 kg)  SpO2 97% Physical Exam  Constitutional: She is oriented to person, place, and time.  Chronically ill, slightly uncomfortable   HENT:  Head: Normocephalic and atraumatic.  Mouth/Throat: Oropharynx is clear and moist.  Small abrasion L face. No bony tenderness. No scalp hematoma   Eyes: Conjunctivae and EOM are normal. Pupils are equal, round, and reactive to light.  Neck: Normal range of motion. Neck supple.  Cardiovascular: Normal rate, regular rhythm and normal heart sounds.   Pulmonary/Chest: Effort normal and breath sounds normal. No respiratory distress. She has no wheezes. She has no rales.  Abdominal: Soft. Bowel sounds are normal. She exhibits no distension. There is no tenderness. There is no rebound.  Musculoskeletal:  L wrist swollen, slightly deformed. 2+ pulses, able to finger grasp. Nl capillary refill. No elbow tenderness or humerus or shoulder tenderness. Hips nl ROM. No other extremity trauma   Neurological: She is alert and oriented to person, place, and time. No cranial nerve deficit. Coordination normal.  Skin: Skin is warm and dry.  Psychiatric: She has a normal mood and affect. Her behavior is normal. Judgment and thought content normal.  Nursing note and vitals reviewed.  ED Course  Procedures (including critical care time) Labs Review Labs Reviewed - No data to display  Imaging Review Dg Wrist Complete Left  05/22/2014   CLINICAL DATA:  Fall, pain/swelling/bruising to posterior left wrist and hand, initial encounter  EXAM: LEFT WRIST - COMPLETE 3+ VIEWa  COMPARISON:  None.  FINDINGS: Comminuted fracture involving the distal radius with suspected intra-articular extension.  Nondisplaced ulnar styloid fracture.  Degenerative changes involving the 1st carpometacarpal joint.  Associated mild soft tissue swelling.  IMPRESSION: Comminuted distal radial fracture, as above.   Nondisplaced ulnar styloid fracture.   Electronically Signed   By: Charline BillsSriyesh  Krishnan M.D.   On: 05/22/2014 21:17   Dg Hand Complete Left  05/22/2014   CLINICAL DATA:  Patient status post fall with pain and swelling about the posterior left wrist and hand.  EXAM: LEFT HAND - COMPLETE 3+ VIEW  COMPARISON:  None.  FINDINGS: Impacted fracture of the distal radius. Minimally displaced ulnar styloid fracture. Marked degenerative change at the base of the thumb at the first [facility]. Regional soft tissues are unremarkable.  IMPRESSION: Impacted distal radius fracture and ulnar styloid fracture. See dedicated wrist radiograph.  Degenerative change at the [facility].   Electronically Signed   By: Annia Beltrew  Davis M.D.   On: 05/22/2014 21:16     EKG Interpretation None      MDM   Final diagnoses:  Injury   [place] Brittney Yang is a 80 y.o. female here with L wrist pain s/Brittney fall. Will get xray to r/o fracture. No head injury so no need for CT. Small abrasions on face, unlikely to have facial fractures. Not on blood thinners.   10:02 PM Xray showed distal radius fracture. Called Dr. Janee Mornhompson, who recommend splint, f/u.      Richardean Canalavid H Gurjit Loconte, MD 05/22/14 2217

## 2014-05-22 NOTE — Progress Notes (Signed)
Orthopedic Tech Progress Note Patient Details:  Brittney [place] P Yang 03/01/1928 [id number] Applied fiberglass sugar tong splint to LUE.  Pulses,  sensation, motion intact before and after application.  Capillary refill less than 2 seconds before and after application.  Placed LUE in arm sling after splinting. Ortho Devices Type of Ortho Device: Sugartong splint, Arm sling Ortho Device/Splint Location: LUE Ortho Device/Splint Interventions: Application   Lesle ChrisGilliland, Amelda Hapke L 05/22/2014, 10:34 PM

## 2014-05-22 NOTE — Discharge Instructions (Signed)
Wear splint at all times. Wear sling for comfort.   Take tylenol, motrin for pain.   Take vicodin for severe pain.   Follow up with Dr. Janee Mornhompson in a week for repeat xray.   Return to ER if you have severe pain, numbness or weakness in hand.

## 2014-05-22 NOTE — ED Notes (Signed)
Ortho at bedside placing splint/sling.

## 2014-05-22 NOTE — ED Notes (Addendum)
The pt slipped on some loose gravelk in the driveway and caught herself on her lt hand.  .  She has abrasions to the lt face around her lt eye.  No loc  She has pain to the lt hand and her lt thumb.  Taken to fast track but the pa does not want to see her in fast track.  Moved to the waiting room

## 2014-07-05 ENCOUNTER — Encounter: Payer: Self-pay | Admitting: Endocrinology

## 2014-07-05 ENCOUNTER — Ambulatory Visit (INDEPENDENT_AMBULATORY_CARE_PROVIDER_SITE_OTHER): Payer: Medicare Other | Admitting: Endocrinology

## 2014-07-05 VITALS — BP 125/76 | HR 82 | Temp 98.3°F | Resp 14 | Ht 60.0 in | Wt 112.2 lb

## 2014-07-05 DIAGNOSIS — M810 Age-related osteoporosis without current pathological fracture: Secondary | ICD-10-CM

## 2014-07-05 DIAGNOSIS — E890 Postprocedural hypothyroidism: Secondary | ICD-10-CM

## 2014-07-05 LAB — VITAMIN D 25 HYDROXY (VIT D DEFICIENCY, FRACTURES): VITD: 13.52 ng/mL — ABNORMAL LOW (ref 30.00–100.00)

## 2014-07-05 LAB — TSH: TSH: 10.35 u[IU]/mL — AB (ref 0.35–4.50)

## 2014-07-05 LAB — T4, FREE: Free T4: 0.68 ng/dL (ref 0.60–1.60)

## 2014-07-05 NOTE — Progress Notes (Addendum)
Patient ID: Brittney [place] P Haith, female   DOB: [birth year], 81 y.o.   MRN: [id number]   Reason for Appointment:  Hypothyroidism, followup visit    History of Present Illness:   The hypothyroidism was first diagnosed in 08/2011  She had required 2 doses of radioactive iodine for her hyperthyroidism before she became hypothyroid Did not have any significant symptoms at the onset of hypothyroidism and her hypothyroidism has been mild  Has been taking relatively low doses of supplement of levothyroxine and her TSH had been fairly consistent Appears to be having a high TSH and her dose has not been changed much because of her age and otherwise she is doing clinically well Has been  taking 50 mcg of levothyroxine with extra 25 mcg weekly and her daughter helps her with the medication regularly  Complaints are reported by the patient now are none, no unusual fatigue or cold intolerance She has gained back some of the rate she had been losing  Patient is unable to give accurate history because of her dementia   Wt Readings from Last 3 Encounters:  07/05/14 112 lb 3.2 oz (50.894 kg)  05/22/14 111 lb 1 oz (50.378 kg)  05/02/14 109 lb 5.6 oz (49.6 kg)     Lab Results  Component Value Date   TSH 4.190 05/02/2014   TSH 0.64 01/01/2014   TSH 4.83 10/02/2013   FREET4 0.90 01/01/2014   FREET4 0.84 10/02/2013   FREET4 1.01 03/31/2013        Medication List       This list is accurate as of: 07/05/14  1:13 PM.  Always use your most recent med list.               aspirin 325 MG tablet  Take 325 mg by mouth daily.     B-12 2500 MCG Tabs  Take 2,500 mcg by mouth daily.     chlordiazePOXIDE 5 MG capsule  Commonly known as:  LIBRIUM  Take 5 mg by mouth at bedtime.     donepezil 10 MG tablet  Commonly known as:  ARICEPT  Take 10 mg by mouth every morning.     HYDROcodone-acetaminophen 5-325 MG per tablet  Commonly known as:  NORCO/VICODIN  Take 1 tablet by mouth every 6 (six) hours  as needed for moderate pain or severe pain.     levothyroxine 50 MCG tablet  Commonly known as:  SYNTHROID, LEVOTHROID  Take 1 tablet (50 mcg total) by mouth daily before breakfast.     QUEtiapine 25 MG tablet  Commonly known as:  SEROQUEL  Take 25 mg by mouth 2 (two) times daily.        Past Medical History  Diagnosis Date  . Thyroid disease   . Tremor   . Dementia     Past Surgical History  Procedure Laterality Date  . Thyroid surgery  1960    No family history on file.  Social History:  reports that she has never smoked. She has never used smokeless tobacco. She reports that she does not drink alcohol or use illicit drugs.  Allergies:  Allergies  Allergen Reactions  . Methimazole [Thiamazole] Anaphylaxis  . Penicillins Hives   REVIEW of systems:  She has ? Parkinson's She has had dementia and is on Aricept and Haldol Previously had history of mild hypertension  She has had a history of osteoporosis previously treated with Fosamax.  Calcium level was relatively low in the hospital and she does not have  recent vitamin D level on file She had a recent wrist fracture after a fall   Examination:   BP 125/76 mmHg  Pulse 82  Temp(Src) 98.3 F (36.8 C)  Resp 14  Ht 5' (1.524 m)  Wt 112 lb 3.2 oz (50.894 kg)  BMI 21.91 kg/m2  SpO2 95%   GENERAL APPEARANCE:  fairly alert     NECK: no thyromegaly.          NEUROLOGIC EXAM:  reflexes 2+ bilaterally at biceps.  Mild coarse tremor present No peripheral edema    Assessments   Hypothyroidism, post ablative and requiring relatively low doses of levothyroxine Her regimen currently is 50 mcg with extra half a tablet weekly Will need to reassess her TSH today and adjust dosage accordingly  History of osteoporosis: Discussed that she should follow-up with PCP regarding bone density Meanwhile will check her vitamin D level   Melinda Pottinger 07/05/2014, 1:13 PM    Addendum: Labs as follows, daughter informed that  thyroid test is low. We will need to change to 125 g, half tablet daily and follow-up in 2 months Vitamin D level very low, start 5000 units vitamin D 3 OTC daily  Office Visit on 07/05/2014  Component Date Value Ref Range Status  . VITD 07/05/2014 13.52* 30.00 - 100.00 ng/mL Final  . Free T4 07/05/2014 0.68  0.60 - 1.60 ng/dL Final  . TSH 03/47/4259 10.35* 0.35 - 4.50 uIU/mL Final

## 2014-07-05 NOTE — Progress Notes (Signed)
Quick Note:  Please let daughter know that the thyroid test is low. Has she taken 50 g levothyroxine 7-1/2 pills weekly before breakfast without any iron or calcium? If so will need to change to 125 g, half tablet daily and follow-up in 2 months Vitamin D level very low, start 5000 units vitamin D 3 OTC daily  ______

## 2014-07-14 ENCOUNTER — Other Ambulatory Visit: Payer: Self-pay | Admitting: *Deleted

## 2014-07-14 ENCOUNTER — Telehealth: Payer: Self-pay | Admitting: Endocrinology

## 2014-07-14 MED ORDER — LEVOTHYROXINE SODIUM 125 MCG PO TABS: ORAL_TABLET | ORAL | Status: DC

## 2014-07-14 NOTE — Telephone Encounter (Signed)
Patient daughter is calling about her mom lab results Brittney Yang, phone # [phone]

## 2014-08-03 NOTE — Telephone Encounter (Signed)
error 

## 2014-09-21 ENCOUNTER — Ambulatory Visit (INDEPENDENT_AMBULATORY_CARE_PROVIDER_SITE_OTHER): Payer: Medicare Other | Admitting: Endocrinology

## 2014-09-21 ENCOUNTER — Encounter: Payer: Self-pay | Admitting: Endocrinology

## 2014-09-21 VITALS — BP 122/72 | HR 75 | Temp 98.3°F | Resp 14 | Ht 60.0 in | Wt 115.2 lb

## 2014-09-21 DIAGNOSIS — E890 Postprocedural hypothyroidism: Secondary | ICD-10-CM | POA: Diagnosis not present

## 2014-09-21 DIAGNOSIS — E559 Vitamin D deficiency, unspecified: Secondary | ICD-10-CM

## 2014-09-21 LAB — COMPREHENSIVE METABOLIC PANEL
ALT: 11 U/L (ref 0–35)
AST: 16 U/L (ref 0–37)
Albumin: 4 g/dL (ref 3.5–5.2)
Alkaline Phosphatase: 63 U/L (ref 39–117)
BILIRUBIN TOTAL: 0.4 mg/dL (ref 0.2–1.2)
BUN: 24 mg/dL — AB (ref 6–23)
CO2: 29 mEq/L (ref 19–32)
CREATININE: 1.32 mg/dL — AB (ref 0.40–1.20)
Calcium: 9.5 mg/dL (ref 8.4–10.5)
Chloride: 104 mEq/L (ref 96–112)
GFR: 40.69 mL/min — ABNORMAL LOW (ref >60.00)
Glucose, Bld: 81 mg/dL (ref 70–99)
Potassium: 4.3 mEq/L (ref 3.5–5.1)
Sodium: 138 mEq/L (ref 135–145)
Total Protein: 7.3 g/dL (ref 6.0–8.3)

## 2014-09-21 LAB — T4, FREE: Free T4: 0.97 ng/dL (ref 0.60–1.60)

## 2014-09-21 LAB — TSH: TSH: 3.24 u[IU]/mL (ref 0.35–4.50)

## 2014-09-21 NOTE — Progress Notes (Signed)
Quick Note:  Please let patient know that the Thyroid testing normal and to continue same dosage.  Since kidney test is slightly higher since to discuss with PCP  ______

## 2014-09-21 NOTE — Progress Notes (Signed)
Patient ID: Brittney Yang, female   DOB: March 18, 1928, 81 y.o.   MRN: [id number]   Reason for Appointment:  Hypothyroidism, followup visit    History of Present Illness:   The hypothyroidism was first diagnosed in 08/2011 She had required 2 doses of radioactive iodine for her hyperthyroidism before she became hypothyroid Did not have any significant symptoms at the onset of hypothyroidism and her hypothyroidism has been mild  Has been taking relatively low doses of supplement of levothyroxine  However in 1/16 her TSH was significantly high at 10.3 although her daughter does not think she has any increased lethargy or change in behavior She was taking 50 g of levothyroxine and additional 25 g weekly Had been compliant with her medication  With increasing TSH she was told to increase her dose to half a 125 g Her daughter thinks that she is more alert and has more energy Patient is unable to give any history again She has also gained some weight  Wt Readings from Last 3 Encounters:  09/21/14 115 lb 3.2 oz (52.254 kg)  07/05/14 112 lb 3.2 oz (50.894 kg)  05/22/14 111 lb 1 oz (50.378 kg)     Lab Results  Component Value Date   TSH 10.35* 07/05/2014   TSH 4.190 05/02/2014   TSH 0.64 01/01/2014   FREET4 0.68 07/05/2014   FREET4 0.90 01/01/2014   FREET4 0.84 10/02/2013        Medication List       This list is accurate as of: 09/21/14  9:39 AM.  Always use your most recent med list.               aspirin 325 MG tablet  Take 325 mg by mouth daily.     B-12 2500 MCG Tabs  Take 2,500 mcg by mouth daily.     chlordiazePOXIDE 5 MG capsule  Commonly known as:  LIBRIUM  Take 5 mg by mouth at bedtime.     donepezil 10 MG tablet  Commonly known as:  ARICEPT  Take 10 mg by mouth every morning.     HYDROcodone-acetaminophen 5-325 MG per tablet  Commonly known as:  NORCO/VICODIN  Take 1 tablet by mouth every 6 (six) hours as needed for moderate pain or severe pain.      levothyroxine 125 MCG tablet  Commonly known as:  SYNTHROID, LEVOTHROID  Take 1/2 tablet daily     QUEtiapine 25 MG tablet  Commonly known as:  SEROQUEL  Take 25 mg by mouth 2 (two) times daily.        Past Medical History  Diagnosis Date  . Thyroid disease   . Tremor   . Dementia     Past Surgical History  Procedure Laterality Date  . Thyroid surgery  1960    No family history on file.  Social History:  reports that she has never smoked. She has never used smokeless tobacco. She reports that she does not drink alcohol or use illicit drugs.  Allergies:  Allergies  Allergen Reactions  . Methimazole [Thiamazole] Anaphylaxis  . Penicillins Hives   REVIEW of systems:   She has ? Parkinson's She has had dementia and is on Aricept and Seroquel  Previously had history of mild hypertension  She has had a history of osteoporosis previously treated with Fosamax.   VITAMIN D deficiency: her vitamin D level was only about 13 and she was told tostart 5000 units vitamin D 3 OTC daily She has done that No  follow-up calcium levels available    Examination:   BP 122/72 mmHg  Pulse 75  Temp(Src) 98.3 F (36.8 C)  Resp 14  Ht 5' (1.524 m)  Wt 115 lb 3.2 oz (52.254 kg)  BMI 22.50 kg/m2  SpO2 97%   GENERAL APPEARANCE: speech is normal but she appears to be focused only on her left arm problem  NEUROLOGIC EXAM:  reflexes 2+ bilaterally at biceps.   No peripheral edema    Assessments   Hypothyroidism, post ablative and requiring relatively low doses of levothyroxine However more recently appears to be requiring an increase in her dosage Her regimen currently is half of 125 g daily Will need to reassess her TSH today and adjust dosage accordingly  Vitamin D deficiency: She has been taking OTC supplement and will need periodic follow-up To check calcium today  History of osteoporosis: Discussed that she should follow-up with PCP regarding bone densitywhich she has  not discussed as yet   Brittney Yang 09/21/2014, 9:39 AM    Addendum: TSH is normal as also calcium Will forward results to PCP  Office Visit on 09/21/2014  Component Date Value Ref Range Status  . TSH 09/21/2014 3.24  0.35 - 4.50 uIU/mL Final  . Free T4 09/21/2014 0.97  0.60 - 1.60 ng/dL Final  . Sodium 21/30/865704/10/2014 138  135 - 145 mEq/L Final  . Potassium 09/21/2014 4.3  3.5 - 5.1 mEq/L Final  . Chloride 09/21/2014 104  96 - 112 mEq/L Final  . CO2 09/21/2014 29  19 - 32 mEq/L Final  . Glucose, Bld 09/21/2014 81  70 - 99 mg/dL Final  . BUN 84/69/629504/10/2014 24* 6 - 23 mg/dL Final  . Creatinine, Ser 09/21/2014 1.32* 0.40 - 1.20 mg/dL Final  . Total Bilirubin 09/21/2014 0.4  0.2 - 1.2 mg/dL Final  . Alkaline Phosphatase 09/21/2014 63  39 - 117 U/L Final  . AST 09/21/2014 16  0 - 37 U/L Final  . ALT 09/21/2014 11  0 - 35 U/L Final  . Total Protein 09/21/2014 7.3  6.0 - 8.3 g/dL Final  . Albumin 28/41/324404/10/2014 4.0  3.5 - 5.2 g/dL Final  . Calcium 01/02/725304/10/2014 9.5  8.4 - 10.5 mg/dL Final  . GFR 66/44/034704/10/2014 40.69* >60.00 mL/min Final

## 2014-09-22 ENCOUNTER — Emergency Department (HOSPITAL_COMMUNITY): Payer: Medicare Other

## 2014-09-22 ENCOUNTER — Encounter (HOSPITAL_COMMUNITY): Payer: Self-pay | Admitting: Family Medicine

## 2014-09-22 ENCOUNTER — Emergency Department (HOSPITAL_COMMUNITY)
Admission: EM | Admit: 2014-09-22 | Discharge: 2014-09-22 | Disposition: A | Discharge status: Home / Self Care | Payer: Medicare Other | Attending: Emergency Medicine | Admitting: Emergency Medicine

## 2014-09-22 DIAGNOSIS — W19XXXA Unspecified fall, initial encounter: Secondary | ICD-10-CM

## 2014-09-22 DIAGNOSIS — Z7982 Long term (current) use of aspirin: Secondary | ICD-10-CM | POA: Diagnosis not present

## 2014-09-22 DIAGNOSIS — Z79899 Other long term (current) drug therapy: Secondary | ICD-10-CM | POA: Insufficient documentation

## 2014-09-22 DIAGNOSIS — G20 Parkinson's disease: Secondary | ICD-10-CM | POA: Insufficient documentation

## 2014-09-22 DIAGNOSIS — R269 Unspecified abnormalities of gait and mobility: Secondary | ICD-10-CM | POA: Diagnosis not present

## 2014-09-22 DIAGNOSIS — Y9389 Activity, other specified: Secondary | ICD-10-CM | POA: Diagnosis not present

## 2014-09-22 DIAGNOSIS — F0390 Unspecified dementia without behavioral disturbance: Secondary | ICD-10-CM | POA: Diagnosis not present

## 2014-09-22 DIAGNOSIS — S62102K Fracture of unspecified carpal bone, left wrist, subsequent encounter for fracture with nonunion: Secondary | ICD-10-CM

## 2014-09-22 DIAGNOSIS — S52612K Displaced fracture of left ulna styloid process, subsequent encounter for closed fracture with nonunion: Secondary | ICD-10-CM | POA: Insufficient documentation

## 2014-09-22 DIAGNOSIS — R42 Dizziness and giddiness: Secondary | ICD-10-CM

## 2014-09-22 DIAGNOSIS — R2681 Unsteadiness on feet: Secondary | ICD-10-CM

## 2014-09-22 DIAGNOSIS — S6992XA Unspecified injury of left wrist, hand and finger(s), initial encounter: Secondary | ICD-10-CM | POA: Diagnosis present

## 2014-09-22 DIAGNOSIS — Z880 Allergy status to penicillin: Secondary | ICD-10-CM | POA: Insufficient documentation

## 2014-09-22 DIAGNOSIS — Y998 Other external cause status: Secondary | ICD-10-CM | POA: Insufficient documentation

## 2014-09-22 DIAGNOSIS — Y9289 Other specified places as the place of occurrence of the external cause: Secondary | ICD-10-CM | POA: Insufficient documentation

## 2014-09-22 DIAGNOSIS — E079 Disorder of thyroid, unspecified: Secondary | ICD-10-CM | POA: Diagnosis not present

## 2014-09-22 DIAGNOSIS — W1839XA Other fall on same level, initial encounter: Secondary | ICD-10-CM | POA: Diagnosis not present

## 2014-09-22 LAB — COMPREHENSIVE METABOLIC PANEL
ALT: 15 U/L (ref 0–35)
ANION GAP: 11 (ref 5–15)
AST: 25 U/L (ref 0–37)
Albumin: 4.1 g/dL (ref 3.5–5.2)
Alkaline Phosphatase: 68 U/L (ref 39–117)
BUN: 23 mg/dL (ref 6–23)
CALCIUM: 9.1 mg/dL (ref 8.4–10.5)
CHLORIDE: 107 mmol/L (ref 96–112)
CO2: 25 mmol/L (ref 19–32)
CREATININE: 1.14 mg/dL — AB (ref 0.50–1.10)
GFR calc Af Amer: 50 mL/min — ABNORMAL LOW (ref >90)
GFR, EST NON AFRICAN AMERICAN: 43 mL/min — AB (ref >90)
GLUCOSE: 88 mg/dL (ref 70–99)
Potassium: 4.1 mmol/L (ref 3.5–5.1)
SODIUM: 143 mmol/L (ref 135–145)
Total Bilirubin: 0.6 mg/dL (ref 0.3–1.2)
Total Protein: 7.4 g/dL (ref 6.0–8.3)

## 2014-09-22 LAB — URINALYSIS, ROUTINE W REFLEX MICROSCOPIC
Bilirubin Urine: NEGATIVE
Glucose, UA: NEGATIVE mg/dL
KETONES UR: NEGATIVE mg/dL
Nitrite: NEGATIVE
PH: 6.5 (ref 5.0–8.0)
Protein, ur: NEGATIVE mg/dL
Specific Gravity, Urine: 1.011 (ref 1.005–1.030)
Urobilinogen, UA: 0.2 mg/dL (ref 0.0–1.0)

## 2014-09-22 LAB — I-STAT CHEM 8, ED
BUN: 33 mg/dL — ABNORMAL HIGH (ref 6–23)
CALCIUM ION: 1.06 mmol/L — AB (ref 1.13–1.30)
Chloride: 105 mmol/L (ref 96–112)
Creatinine, Ser: 1.1 mg/dL (ref 0.50–1.10)
Glucose, Bld: 85 mg/dL (ref 70–99)
HCT: 46 % (ref 36.0–46.0)
HEMOGLOBIN: 15.6 g/dL — AB (ref 12.0–15.0)
Potassium: 4.1 mmol/L (ref 3.5–5.1)
Sodium: 142 mmol/L (ref 135–145)
TCO2: 24 mmol/L (ref 0–100)

## 2014-09-22 LAB — URINE MICROSCOPIC-ADD ON

## 2014-09-22 LAB — RAPID URINE DRUG SCREEN, HOSP PERFORMED
AMPHETAMINES: NOT DETECTED
Barbiturates: NOT DETECTED
Benzodiazepines: POSITIVE — AB
Cocaine: NOT DETECTED
OPIATES: NOT DETECTED
Tetrahydrocannabinol: NOT DETECTED

## 2014-09-22 LAB — I-STAT TROPONIN, ED: TROPONIN I, POC: 0 ng/mL (ref 0.00–0.08)

## 2014-09-22 LAB — ETHANOL

## 2014-09-22 MED ORDER — LORAZEPAM 1 MG PO TABS
0.5000 mg | ORAL_TABLET | Freq: Once | ORAL | Status: AC
  Administered 2014-09-22: 0.5 mg via ORAL
  Filled 2014-09-22: qty 1

## 2014-09-22 NOTE — Progress Notes (Signed)
NEURO HOSPITALIST CONSULT NOTE    Reason for Consult: gait instability  HPI:                                                                                                                                          Brittney Yang is an 81 y.o. female who has been seen by Dr. Arbutus Leas as out patient for gait instability and parkinson like features likely secondary to Haldol. Patient was seen on 12/2013 and switched to Seroquel at that time.  Per daughter her symptoms dramatically improved.  She has also been seen in the past for syncopal episodes. Work up for syncope was unremarkable at that time. Today she was with her daughter in [place] when she told her daughter she felt dizzy. Her daughter brought her to a seat and turned around to get her something to eat (she had not eaten for 4 hours).  When she turned around she noted a bystander next to patietn on the floor. PAtient states she tried to get up and lost her balance and fell.  She did not loose conciousness. EMS was called but due to patient being back to baseline she was not brought to hospital.  Daughter in turn drove her to hospital. Neurology was called to gait instability.  In speaking with daughter in room she is unclear why.  She does not fel her mothers gait has been off. She is only aware of the syncope. Daughter states her mother walks without walker and has a short step but has not been falling or tripping over things.   Currently back to baseline per daughter.   Past Medical History  Diagnosis Date  . Thyroid disease   . Tremor   . Dementia     Past Surgical History  Procedure Laterality Date  . Thyroid surgery  1960    Family History  Problem Relation Age of Onset  . Hypertension Mother   . Hypertension Father      Social History:  reports that she has never smoked. She has never used smokeless tobacco. She reports that she does not drink alcohol or use illicit drugs.  Allergies  Allergen Reactions   . Methimazole [Thiamazole] Anaphylaxis  . Penicillins Hives    MEDICATIONS:  No current facility-administered medications for this encounter.   Current Outpatient Prescriptions  Medication Sig Dispense Refill  . aspirin 325 MG tablet Take 325 mg by mouth at bedtime.     . chlordiazePOXIDE (LIBRIUM) 5 MG capsule Take 5 mg by mouth at bedtime.     . Cholecalciferol (VITAMIN D3) 50000 UNITS TABS Take 5,000 Units by mouth at bedtime.    . Cyanocobalamin (B-12) 2500 MCG TABS Take 2,500 mcg by mouth daily.    Marland Kitchen donepezil (ARICEPT) 10 MG tablet Take 10 mg by mouth at bedtime.     Marland Kitchen levothyroxine (SYNTHROID, LEVOTHROID) 125 MCG tablet Take 1/2 tablet daily 45 tablet 1  . QUEtiapine (SEROQUEL) 25 MG tablet Take 25 mg by mouth 2 (two) times daily.    Marland Kitchen HYDROcodone-acetaminophen (NORCO/VICODIN) 5-325 MG per tablet Take 1 tablet by mouth every 6 (six) hours as needed for moderate pain or severe pain. (Patient not taking: Reported on 09/22/2014) 10 tablet 0      ROS:                                                                                                                                       History obtained from unobtainable from patient due to mental status and but obtained by daughter.   General ROS: negative for - chills, fatigue, fever, night sweats, weight gain or weight loss Psychological ROS: negative for - behavioral disorder, hallucinations, memory difficulties, mood swings or suicidal ideation Ophthalmic ROS: negative for - blurry vision, double vision, eye pain or loss of vision ENT ROS: negative for - epistaxis, nasal discharge, oral lesions, sore throat, tinnitus or vertigo Allergy and Immunology ROS: negative for - hives or itchy/watery eyes Hematological and Lymphatic ROS: negative for - bleeding problems, bruising or swollen lymph nodes Endocrine ROS: negative  for - galactorrhea, hair pattern changes, polydipsia/polyuria or temperature intolerance Respiratory ROS: negative for - cough, hemoptysis, shortness of breath or wheezing Cardiovascular ROS: negative for - chest pain, dyspnea on exertion, edema or irregular heartbeat Gastrointestinal ROS: negative for - abdominal pain, diarrhea, hematemesis, nausea/vomiting or stool incontinence Genito-Urinary ROS: negative for - dysuria, hematuria, incontinence or urinary frequency/urgency Musculoskeletal ROS: negative for - joint swelling or muscular weakness Neurological ROS: as noted in HPI Dermatological ROS: negative for rash and skin lesion changes   Blood pressure 161/75, pulse 75, temperature 97.5 F (36.4 C), temperature source Oral, resp. rate 17, SpO2 100 %.   Neurologic Examination:  HEENT-  Normocephalic, no lesions, without obvious abnormality.  Normal external eye and conjunctiva.  Normal TM's bilaterally.  Normal auditory canals and external ears. Normal external nose, mucus membranes and septum.  Normal pharynx. Cardiovascular- S1, S2 normal, pulses palpable throughout   Lungs- chest clear, no wheezing, rales, normal symmetric air entry Abdomen- normal findings: bowel sounds normal Extremities- no edema Lymph-no adenopathy palpable Musculoskeletal-no joint tenderness, deformity or swelling Skin-warm and dry, no hyperpigmentation, vitiligo, or suspicious lesions  Neurological Examination Mental Status: Alert, oriented to hospital and year "2000 something".  Speech fluent without evidence of aphasia.  Able to follow 3 step commands without difficulty. Cranial Nerves: II: Discs flat bilaterally; Visual fields grossly normal, pupils equal, round, reactive to light and accommodation III,IV, VI: ptosis not present, extra-ocular motions intact bilaterally V,VII: smile symmetric, facial light  touch sensation normal bilaterally VIII: hearing normal bilaterally IX,X: uvula rises symmetrically XI: bilateral shoulder shrug XII: midline tongue extension Motor: Right : Upper extremity   5/5    Left:     Upper extremity   5/5  Lower extremity   5/5     Lower extremity   5/5 --cogwheel rigidity --increased tone throughout Tone and bulk:normal tone throughout; no atrophy noted Sensory: Pinprick and light touch intact throughout, bilaterally Deep Tendon Reflexes: 2+ and symmetric throughout Plantars: Right: downgoing   Left: downgoing Cerebellar: normal finger-to-nose, normal rapid alternating movements and normal heel-to-shin test Gait: gait shows narrow based with small steps but when prompted to take larger steps, the gait became more fluid. multi step turns.       Lab Results: Basic Metabolic Panel:  Recent Labs Lab 09/21/14 1005  NA 138  K 4.3  CL 104  CO2 29  GLUCOSE 81  BUN 24*  CREATININE 1.32*  CALCIUM 9.5    Liver Function Tests:  Recent Labs Lab 09/21/14 1005  AST 16  ALT 11  ALKPHOS 63  BILITOT 0.4  PROT 7.3  ALBUMIN 4.0   No results for input(s): LIPASE, AMYLASE in the last 168 hours. No results for input(s): AMMONIA in the last 168 hours.  CBC: No results for input(s): WBC, NEUTROABS, HGB, HCT, MCV, PLT in the last 168 hours.  Cardiac Enzymes: No results for input(s): CKTOTAL, CKMB, CKMBINDEX, TROPONINI in the last 168 hours.  Lipid Panel: No results for input(s): CHOL, TRIG, HDL, CHOLHDL, VLDL, LDLCALC in the last 168 hours.  CBG: No results for input(s): GLUCAP in the last 168 hours.  Microbiology: Results for orders placed or performed during the hospital encounter of 05/02/14  Blood culture (routine x 2)     Status: None   Collection Time: 05/02/14 11:49 AM  Result Value Ref Range Status   Specimen Description BLOOD LEFT ARM  Final   Special Requests BOTTLES DRAWN AEROBIC AND ANAEROBIC 5CC  Final   Culture  Setup Time   Final     05/02/2014 19:34 Performed at [facility]    Culture   Final    NO GROWTH 5 DAYS Note: Culture results may be compromised due to an excessive volume of blood received in culture bottles. Performed at [facility]    Report Status 05/08/2014 FINAL  Final  Blood culture (routine x 2)     Status: None   Collection Time: 05/02/14 11:54 AM  Result Value Ref Range Status   Specimen Description BLOOD RIGHT HAND  Final   Special Requests BOTTLES DRAWN AEROBIC ONLY 2CC  Final   Culture  Setup  Time   Final    05/02/2014 19:21 [facility]    Culture   Final    NO GROWTH 5 DAYS [facility] at [facility]    Report Status 05/08/2014 FINAL  Final    Coagulation Studies: No results for input(s): LABPROT, INR in the last 72 hours.  Imaging: No results found.     Assessment and plan per attending neurologist  Felicie Mornavid Smith PA-C [facility] [phone]  09/22/2014, 2:05 PM   Assessment/Plan: 81 YO female presenting to ED with presyncopal episode. Neurology was consulted for gait instability.  Exam shows parkinsonian like symptoms including increased rigidity, small steps with multi step turn and cogwheel rigidity. At present time patient is back to baseline.   No further neurological workup needed at this time. Please call with any further questions or concerns.   Ritta SlotMcNeill Admire Bunnell, MD [facility] [phone]  If 7pm- 7am, please page neurology on call as listed in [facility].

## 2014-09-22 NOTE — Discharge Instructions (Signed)
Fall Prevention and Home Safety Falls cause injuries and can affect all age groups. It is possible to use preventive measures to significantly decrease the likelihood of falls. There are many simple measures which can make your home safer and prevent falls. OUTDOORS  Repair cracks and edges of walkways and driveways.  Remove high doorway thresholds.  Trim shrubbery on the main path into your home.  Have good outside lighting.  Clear walkways of tools, rocks, debris, and clutter.  Check that handrails are not broken and are securely fastened. Both sides of steps should have handrails.  Have leaves, snow, and ice cleared regularly.  Use sand or salt on walkways during winter months.  In the garage, clean up grease or oil spills. BATHROOM  Install night lights.  Install grab bars by the toilet and in the tub and shower.  Use non-skid mats or decals in the tub or shower.  Place a plastic non-slip stool in the shower to sit on, if needed.  Keep floors dry and clean up all water on the floor immediately.  Remove soap buildup in the tub or shower on a regular basis.  Secure bath mats with non-slip, double-sided rug tape.  Remove throw rugs and tripping hazards from the floors. BEDROOMS  Install night lights.  Make sure a bedside light is easy to reach.  Do not use oversized bedding.  Keep a telephone by your bedside.  Have a firm chair with side arms to use for getting dressed.  Remove throw rugs and tripping hazards from the floor. KITCHEN  Keep handles on pots and pans turned toward the center of the stove. Use back burners when possible.  Clean up spills quickly and allow time for drying.  Avoid walking on wet floors.  Avoid hot utensils and knives.  Position shelves so they are not too high or low.  Place commonly used objects within easy reach.  If necessary, use a sturdy step stool with a grab bar when reaching.  Keep electrical cables out of the  way.  Do not use floor polish or wax that makes floors slippery. If you must use wax, use non-skid floor wax.  Remove throw rugs and tripping hazards from the floor. STAIRWAYS  Never leave objects on stairs.  Place handrails on both sides of stairways and use them. Fix any loose handrails. Make sure handrails on both sides of the stairways are as long as the stairs.  Check carpeting to make sure it is firmly attached along stairs. Make repairs to worn or loose carpet promptly.  Avoid placing throw rugs at the top or bottom of stairways, or properly secure the rug with carpet tape to prevent slippage. Get rid of throw rugs, if possible.  Have an electrician put in a light switch at the top and bottom of the stairs. OTHER FALL PREVENTION TIPS  Wear low-heel or rubber-soled shoes that are supportive and fit well. Wear closed toe shoes.  When using a stepladder, make sure it is fully opened and both spreaders are firmly locked. Do not climb a closed stepladder.  Add color or contrast paint or tape to grab bars and handrails in your home. Place contrasting color strips on first and last steps.  Learn and use mobility aids as needed. Install an electrical emergency response system.  Turn on lights to avoid dark areas. Replace light bulbs that burn out immediately. Get light switches that glow.  Arrange furniture to create clear pathways. Keep furniture in the same place.  Firmly attach carpet with non-skid or double-sided tape.  Eliminate uneven floor surfaces.  Select a carpet pattern that does not visually hide the edge of steps.  Be aware of all pets. OTHER HOME SAFETY TIPS  Set the water temperature for 120 F (48.8 C).  Keep emergency numbers on or near the telephone.  Keep smoke detectors on every level of the home and near sleeping areas. Document Released: 05/25/2002 Document Revised: 12/04/2011 Document Reviewed: 08/24/2011 [facility] Patient Information 2015  [place], [place]. This information is not intended to replace advice given to you by your health care provider. Make sure you discuss any questions you have with your health care provider.  Parkinson Disease Parkinson disease is a disorder of the central nervous system, which includes the brain and spinal cord. A person with this disease slowly loses the ability to completely control body movements. Within the brain, there is a group of nerve cells (basal ganglia) that help control movement. The basal ganglia are damaged and do not work properly in a person with Parkinson disease. In addition, the basal ganglia produce and use a brain chemical called dopamine. The dopamine chemical sends messages to other parts of the body to control and coordinate body movements. Dopamine levels are low in a person with Parkinson disease. If the dopamine levels are low, then the body does not receive the correct messages it needs to move normally.  CAUSES  The exact reason why the basal ganglia get damaged is not known. Some medical researchers have thought that infection, genes, environment, and certain medicines may contribute to the cause.  SYMPTOMS   An early symptom of Parkinson disease is often an uncontrolled shaking (tremor) of the hands. The tremor will often disappear when the affected hand is consciously used.  As the disease progresses, walking, talking, getting out of a chair, and new movements become more difficult.  Muscles get stiff and movements become slower.  Balance and coordination become harder.  Depression, trouble swallowing, urinary problems, constipation, and sleep problems can occur.  Later in the disease, memory and thought processes may deteriorate. DIAGNOSIS  There are no specific tests to diagnose Parkinson disease. You may be referred to a neurologist for evaluation. Your caregiver will ask about your medical history, symptoms, and perform a physical exam. Blood tests and imaging  tests of your brain may be performed to rule out other diseases. The imaging tests may include an MRI or a CT scan. TREATMENT  The goal of treatment is to relieve symptoms. Medicines may be prescribed once the symptoms become troublesome. Medicine will not stop the progression of the disease, but medicine can make movement and balance better and help control tremors. Speech and occupational therapy may also be prescribed. Sometimes, surgical treatment of the brain can be done in young people. HOME CARE INSTRUCTIONS  Get regular exercise and rest periods during the day to help prevent exhaustion and depression.  If getting dressed becomes difficult, replace buttons and zippers with Velcro and elastic on your clothing.  Take all medicine as directed by your caregiver.  Install grab bars or railings in your home to prevent falls.  Go to speech or occupational therapy as directed.  Keep all follow-up visits as directed by your caregiver. SEEK MEDICAL CARE IF:  Your symptoms are not controlled with your medicine.  You fall.  You have trouble swallowing or choke on your food. MAKE SURE YOU:  Understand these instructions.  Will watch your condition.  Will get help right  away if you are not doing well or get worse. Document Released: 06/01/2000 Document Revised: 09/29/2012 Document Reviewed: 07/04/2011 [facility] Patient Information 2015 [place]. This information is not intended to replace advice given to you by your health care provider. Make sure you discuss any questions you have with your health care provider.

## 2014-09-22 NOTE — ED Provider Notes (Signed)
CSN: [id number]     Arrival date & time 09/22/14  1201 History   First MD Initiated Contact with Patient 09/22/14 1224     Chief Complaint  Patient presents with  . Fall     (Consider location/radiation/quality/duration/timing/severity/associated sxs/prior Treatment) HPI  Brittney Yang is a(n) 81 y.o. female who presents to the ed with cc of fall. Hx is given by the daughter and there is a level5 caveat due to dementia. She also has a pmh of parkinsonism. [facility] in [facility]. She suddenly became ataxic and unsteady on her feet. She was unable to stand in line so someone got a wheel chair and the patient sat on a bench while her family checked out. A person at the store came and let the family know that the patient had fallen out of bench. The fall was unwitnessed.  Past Medical History  Diagnosis Date  . Thyroid disease   . Tremor   . Dementia    Past Surgical History  Procedure Laterality Date  . Thyroid surgery  1960   History reviewed. No pertinent family history. History  Substance Use Topics  . Smoking status: Never Smoker   . Smokeless tobacco: Never Used  . Alcohol Use: No   OB History    No data available     Review of Systems  Unable to perform ROS: Dementia      Allergies  Methimazole and Penicillins  Home Medications   Prior to Admission medications   Medication Sig Start Date End Date Taking? Authorizing Provider  aspirin 325 MG tablet Take 325 mg by mouth at bedtime.    Yes Historical Provider, MD  chlordiazePOXIDE (LIBRIUM) 5 MG capsule Take 5 mg by mouth at bedtime.    Yes Historical Provider, MD  Cholecalciferol (VITAMIN D3) 50000 UNITS TABS Take 5,000 Units by mouth at bedtime.   Yes Historical Provider, MD  Cyanocobalamin (B-12) 2500 MCG TABS Take 2,500 mcg by mouth daily.   Yes Historical Provider, MD  donepezil (ARICEPT) 10 MG tablet Take 10 mg by mouth at bedtime.    Yes Historical Provider, MD  levothyroxine (SYNTHROID,  LEVOTHROID) 125 MCG tablet Take 1/2 tablet daily 07/14/14  Yes Reather LittlerAjay Kumar, MD  QUEtiapine (SEROQUEL) 25 MG tablet Take 25 mg by mouth 2 (two) times daily.   Yes Historical Provider, MD  HYDROcodone-acetaminophen (NORCO/VICODIN) 5-325 MG per tablet Take 1 tablet by mouth every 6 (six) hours as needed for moderate pain or severe pain. Patient not taking: Reported on 09/22/2014 05/22/14   Richardean Canalavid H Yao, MD   BP 161/75 mmHg  Pulse 75  Temp(Src) 97.7 F (36.5 C) (Oral)  Resp 17  SpO2 100% Physical Exam  Constitutional: She is oriented to person, place, and time. She appears well-developed and well-nourished. No distress.  HENT:  Head: Normocephalic and atraumatic.  Eyes: Conjunctivae are normal. No scleral icterus.  Neck: Normal range of motion.  Cardiovascular: Normal rate, regular rhythm and normal heart sounds.  Exam reveals no gallop and no friction rub.   No murmur heard. Pulmonary/Chest: Effort normal and breath sounds normal. No respiratory distress.  Abdominal: Soft. Bowel sounds are normal. She exhibits no distension and no mass. There is no tenderness. There is no guarding.  Neurological: She is alert and oriented to person, place, and time.  Skin: Skin is warm and dry. She is not diaphoretic.    ED Course  Procedures (including critical care time) Labs Review Labs Reviewed - No  data to display  Imaging Review No results found.   EKG Interpretation   Date/Time:  Wednesday September 22 2014 12:31:05 EDT Ventricular Rate:  69 PR Interval:  204 QRS Duration: 88 QT Interval:  437 QTC Calculation: 468 R Axis:   57 Text Interpretation:  Sinus rhythm Normal ECG since last tracing no  significant change Confirmed by MILLER  MD, BRIAN ([id number]) on 09/22/2014  1:21:26 PM      MDM   Final diagnoses:  None    Patient with ataxia, history of parkinsonism from Haldol use. Dr. Amada Jupiter has seen the patient and canceled MRI. He feels this is an exacerbation of her parkinsonism.  Awaiting UA and wrist x-ray at this time. Labs are otherwise without significant abnormality.   Patient UA equivocal is sent for culture. Her wrist x-ray shows nonhealing fracture. I advised patient to follow back up with Dr. Janee Morn. Wear her brace until she sees the doctor. I advised patient to follow up with her primary care physician to have ambulatory aids at home when needed. She appears stable for discharge at this time.    Arthor Captain, PA-C 09/22/14 1636  Eber Hong, MD 09/23/14 [phone]

## 2014-09-22 NOTE — ED Notes (Signed)
Pt here for fall. Per family, sts she was at the store and started to become unstable on her feet. sts was sat down. sts family went to check out and pt fell in the floor from chair. Unsure if she hit her head. sts some left hand pain. sts hx of fracture in the hand.

## 2014-09-22 NOTE — ED Provider Notes (Signed)
The patient complains of having some disequilibrium, with some difficulty walking while in the store, the patient does have a history of parkinsonism. On exam the patient has mild tremor bilaterally, she is able to follow commands with minimal difficulty, has normal grip, normal cranial nerves III through XII, normal speech, she has some difficulty with standing, this seems to be baseline according to the patient and her family member. Neurology was consult at, they recommended not getting an MRI, disposition of the patient was home without any further testing. The patient for discharge. Of note she did have mild hypertension but stable at this time.   EKG Interpretation  Date/Time:  Wednesday September 22 2014 12:31:05 EDT Ventricular Rate:  69 PR Interval:  204 QRS Duration: 88 QT Interval:  437 QTC Calculation: 468 R Axis:   57 Text Interpretation:  Sinus rhythm Normal ECG since last tracing no significant change Confirmed by Kentarius Partington  MD, Navpreet Szczygiel ([id number]) on 09/22/2014 1:21:26 PM       Medical screening examination/treatment/procedure(s) were conducted as a shared visit with non-physician practitioner(s) and myself.  I personally evaluated the patient during the encounter.  Clinical Impression:   Final diagnoses:  Fall, initial encounter  Parkinsonism  Gait instability  Fracture of left wrist with nonunion         Eber HongBrian Shavette Shoaff, MD 09/23/14 [phone]

## 2014-09-22 NOTE — ED Notes (Signed)
PA at the bedside.

## 2014-09-23 LAB — URINE CULTURE
Colony Count: 50000
Special Requests: NORMAL

## 2014-12-13 ENCOUNTER — Other Ambulatory Visit: Payer: Self-pay

## 2015-01-03 ENCOUNTER — Ambulatory Visit: Payer: Medicare Other | Admitting: Endocrinology

## 2015-01-17 ENCOUNTER — Other Ambulatory Visit: Payer: Self-pay | Admitting: *Deleted

## 2015-01-17 MED ORDER — LEVOTHYROXINE SODIUM 125 MCG PO TABS: ORAL_TABLET | ORAL | Status: DC

## 2015-01-21 ENCOUNTER — Ambulatory Visit: Payer: Medicare Other | Admitting: Endocrinology

## 2015-02-11 ENCOUNTER — Encounter: Payer: Self-pay | Admitting: Endocrinology

## 2015-02-11 ENCOUNTER — Ambulatory Visit (INDEPENDENT_AMBULATORY_CARE_PROVIDER_SITE_OTHER): Payer: Medicare Other | Admitting: Endocrinology

## 2015-02-11 VITALS — BP 126/78 | HR 81 | Temp 98.3°F | Resp 14 | Ht 60.0 in | Wt 123.4 lb

## 2015-02-11 DIAGNOSIS — E559 Vitamin D deficiency, unspecified: Secondary | ICD-10-CM | POA: Diagnosis not present

## 2015-02-11 DIAGNOSIS — E890 Postprocedural hypothyroidism: Secondary | ICD-10-CM | POA: Diagnosis not present

## 2015-02-11 LAB — TSH: TSH: 4.69 u[IU]/mL — ABNORMAL HIGH (ref 0.35–4.50)

## 2015-02-11 LAB — VITAMIN D 25 HYDROXY (VIT D DEFICIENCY, FRACTURES): VITD: 59.85 ng/mL (ref 30.00–100.00)

## 2015-02-11 LAB — T4, FREE: Free T4: 0.64 ng/dL (ref 0.60–1.60)

## 2015-02-11 NOTE — Progress Notes (Signed)
Patient ID: Brittney Yang, female   DOB: [birth year], 81 y.o.   MRN: [id number]   Reason for Appointment:  Hypothyroidism, followup visit    History of Present Illness:   The hypothyroidism was first diagnosed in 08/2011 She had required 2 doses of radioactive iodine for her hyperthyroidism before she became hypothyroid Did not have any significant symptoms at the onset of hypothyroidism and her hypothyroidism has been mild  Has been taking relatively low doses of supplement of levothyroxine  With increasing TSH she was told to increase her dose to half a 125 g in 1/16 Subsequent TSH was normal  Her daughter thinks that she is more alert and has no fatigue but has gained weight again No heat or cold intolerance Patient is unable to give any history again   Wt Readings from Last 3 Encounters:  02/11/15 123 lb 6.4 oz (55.974 kg)  09/21/14 115 lb 3.2 oz (52.254 kg)  07/05/14 112 lb 3.2 oz (50.894 kg)     Lab Results  Component Value Date   TSH 3.24 09/21/2014   TSH 10.35* 07/05/2014   TSH 4.190 05/02/2014   FREET4 0.97 09/21/2014   FREET4 0.68 07/05/2014   FREET4 0.90 01/01/2014        Medication List       This list is accurate as of: 02/11/15  1:35 PM.  Always use your most recent med list.               aspirin 325 MG tablet  Take 325 mg by mouth at bedtime.     B-12 2500 MCG Tabs  Take 2,500 mcg by mouth daily.     chlordiazePOXIDE 5 MG capsule  Commonly known as:  LIBRIUM  Take 5 mg by mouth at bedtime.     donepezil 10 MG tablet  Commonly known as:  ARICEPT  Take 10 mg by mouth at bedtime.     HYDROcodone-acetaminophen 5-325 MG per tablet  Commonly known as:  NORCO/VICODIN  Take 1 tablet by mouth every 6 (six) hours as needed for moderate pain or severe pain.     levothyroxine 125 MCG tablet  Commonly known as:  SYNTHROID, LEVOTHROID  Take 1/2 tablet daily     QUEtiapine 25 MG tablet  Commonly known as:  SEROQUEL  Take 25 mg by mouth 3  (three) times daily.     Vitamin D3 50000 UNITS Tabs  Take 5,000 Units by mouth at bedtime.        Past Medical History  Diagnosis Date  . Thyroid disease   . Tremor   . Dementia     Past Surgical History  Procedure Laterality Date  . Thyroid surgery  1960    Family History  Problem Relation Age of Onset  . Hypertension Mother   . Hypertension Father     Social History:  reports that she has never smoked. She has never used smokeless tobacco. She reports that she does not drink alcohol or use illicit drugs.  Allergies:  Allergies  Allergen Reactions  . Methimazole [Methimazole] Anaphylaxis  . Penicillins Hives   REVIEW of systems:   She has ? Parkinson's She has had dementia and is on Aricept and Seroquel  She has had a history of osteoporosis previously treated with Fosamax.   VITAMIN D deficiency: her vitamin D level was only about 13 and she was told tostart 5000 units vitamin D 3 OTC daily She has done that    Examination:   BP  126/78 mmHg  Pulse 81  Temp(Src) 98.3 F (36.8 C)  Resp 14  Ht 5' (1.524 m)  Wt 123 lb 6.4 oz (55.974 kg)  BMI 24.10 kg/m2  SpO2 95%   GENERAL APPEARANCE: She is very alert and calm indicating  NEUROLOGIC EXAM:  reflexes 2+ bilaterally at biceps. Neck exam shows no palpable abnormality   No peripheral edema    Assessments   Hypothyroidism, post ablative and requiring relatively low doses of levothyroxine previously but had needed an increase in dosage in 1/16  Her regimen currently is half of 125 g daily Will need to reassess her TSH today and adjust dosage accordingly  Vitamin D deficiency: She has been taking OTC  vitamin D3 5,000 units daily and will have her level checked today    Brittney Yang 02/11/2015, 1:35 PM    Addendum:   Labs as follows.  TSH minimally increased, vitamin D level normal, no changes needed   Office Visit on 02/11/2015  Component Date Value Ref Range Status  . TSH 02/11/2015 4.69* 0.35 -  4.50 uIU/mL Final  . VITD 02/11/2015 59.85  30.00 - 100.00 ng/mL Final  . Free T4 02/11/2015 0.64  0.60 - 1.60 ng/dL Final

## 2015-02-12 NOTE — Progress Notes (Signed)
Quick Note:  Please let patient know that the lab results are in a good range for her age, no change needed  ______

## 2015-07-19 ENCOUNTER — Other Ambulatory Visit: Payer: Self-pay | Admitting: *Deleted

## 2015-07-19 MED ORDER — LEVOTHYROXINE SODIUM 125 MCG PO TABS
ORAL_TABLET | ORAL | Status: DC
Start: 2015-07-19 — End: 2015-08-17

## 2015-08-15 ENCOUNTER — Ambulatory Visit (INDEPENDENT_AMBULATORY_CARE_PROVIDER_SITE_OTHER): Payer: Medicare Other | Admitting: Endocrinology

## 2015-08-15 ENCOUNTER — Encounter: Payer: Self-pay | Admitting: Endocrinology

## 2015-08-15 VITALS — BP 122/82 | HR 79 | Temp 97.8°F | Resp 14 | Ht 60.0 in | Wt 131.4 lb

## 2015-08-15 DIAGNOSIS — E890 Postprocedural hypothyroidism: Secondary | ICD-10-CM | POA: Diagnosis not present

## 2015-08-15 DIAGNOSIS — E559 Vitamin D deficiency, unspecified: Secondary | ICD-10-CM

## 2015-08-15 LAB — BASIC METABOLIC PANEL
BUN: 18 mg/dL (ref 6–23)
CALCIUM: 9.3 mg/dL (ref 8.4–10.5)
CO2: 28 mEq/L (ref 19–32)
Chloride: 101 mEq/L (ref 96–112)
Creatinine, Ser: 1.22 mg/dL — ABNORMAL HIGH (ref 0.40–1.20)
GFR: 44.47 mL/min — ABNORMAL LOW (ref >60.00)
GLUCOSE: 101 mg/dL — AB (ref 70–99)
POTASSIUM: 4.4 meq/L (ref 3.5–5.1)
SODIUM: 138 meq/L (ref 135–145)

## 2015-08-15 LAB — TSH: TSH: 9.65 u[IU]/mL — ABNORMAL HIGH (ref 0.35–4.50)

## 2015-08-15 LAB — VITAMIN D 25 HYDROXY (VIT D DEFICIENCY, FRACTURES): VITD: 30.87 ng/mL (ref 30.00–100.00)

## 2015-08-15 LAB — T4, FREE: Free T4: 0.55 ng/dL — ABNORMAL LOW (ref 0.60–1.60)

## 2015-08-15 NOTE — Addendum Note (Signed)
Addended by: Adline Mango I on: 08/15/2015 03:42 PM   Modules accepted: Orders

## 2015-08-15 NOTE — Patient Instructions (Signed)
Vitamin D3, 2000  units

## 2015-08-15 NOTE — Progress Notes (Signed)
Patient ID: Brittney Yang, female   DOB: 1927/08/19, 82 y.o.   MRN: [id number]   Reason for Appointment:  Hypothyroidism, followup visit    History of Present Illness:   The hypothyroidism was first diagnosed in 08/2011 She had required 2 doses of radioactive iodine for her hyperthyroidism before she became hypothyroid Did not have any significant symptoms at the onset of hypothyroidism and her hypothyroidism has been mild  Has been taking relatively low doses of supplement of levothyroxine  In 1/16 TSH she was told to increase her dose to half of the 125 g  Her daughter thinks that she since then has been more alert and has no fatigue but has gained weight again Occasional cold intolerance present However she is less active overall  Patient is unable to give any history again  TSH was 4.7 on her last visit and her dose was continued unchanged  Wt Readings from Last 3 Encounters:  08/15/15 131 lb 6.4 oz (59.603 kg)  02/11/15 123 lb 6.4 oz (55.974 kg)  09/21/14 115 lb 3.2 oz (52.254 kg)     Lab Results  Component Value Date   TSH 4.69* 02/11/2015   TSH 3.24 09/21/2014   TSH 10.35* 07/05/2014   FREET4 0.64 02/11/2015   FREET4 0.97 09/21/2014   FREET4 0.68 07/05/2014        Medication List       This list is accurate as of: 08/15/15 10:24 AM.  Always use your most recent med list.               aspirin 325 MG tablet  Take 325 mg by mouth at bedtime.     B-12 2500 MCG Tabs  Take 2,500 mcg by mouth daily.     chlordiazePOXIDE 5 MG capsule  Commonly known as:  LIBRIUM  Take 5 mg by mouth at bedtime.     donepezil 10 MG tablet  Commonly known as:  ARICEPT  Take 10 mg by mouth at bedtime.     HYDROcodone-acetaminophen 5-325 MG tablet  Commonly known as:  NORCO/VICODIN  Take 1 tablet by mouth every 6 (six) hours as needed for moderate pain or severe pain.     levothyroxine 125 MCG tablet  Commonly known as:  SYNTHROID, LEVOTHROID  Take 1/2 tablet daily      QUEtiapine 25 MG tablet  Commonly known as:  SEROQUEL  Take 25 mg by mouth 3 (three) times daily.     Vitamin D3 50000 units Tabs  Take 5,000 Units by mouth at bedtime.        Past Medical History  Diagnosis Date  . Thyroid disease   . Tremor   . Dementia     Past Surgical History  Procedure Laterality Date  . Thyroid surgery  1960    Family History  Problem Relation Age of Onset  . Hypertension Mother   . Hypertension Father     Social History:  reports that she has never smoked. She has never used smokeless tobacco. She reports that she does not drink alcohol or use illicit drugs.  Allergies:  Allergies  Allergen Reactions  . Methimazole [Methimazole] Anaphylaxis  . Penicillins Hives   REVIEW of systems:   She has ? Parkinson's She has had dementia and is on Aricept and Seroquel  She has had a history of osteoporosis previously treated with Fosamax.    VITAMIN D deficiency: her vitamin D level was only about 13 and she had a good level with  5,000 units vitamin D3 daily    Examination:   BP 122/82 mmHg  Pulse 79  Temp(Src) 97.8 F (36.6 C)  Resp 14  Ht 5' (1.524 m)  Wt 131 lb 6.4 oz (59.603 kg)  BMI 25.66 kg/m2  SpO2 96%  She is looking well and conversing   NEUROLOGIC EXAM:  reflexes 2+ bilaterally at biceps. Neck exam shows no palpable abnormality   No peripheral edema or skin changes    Assessments   Hypothyroidism, post ablative and requiring relatively low doses of levothyroxine, now taking 62.5 g Clinically doing well Will need to reassess her TSH today and adjust dosage accordingly  Vitamin D deficiency: She has been taking OTC  vitamin D3 5,000 units daily and with her last level being 55 will reduce the dose to 2000 units now    [facility] 08/15/2015, 10:24 AM

## 2015-08-17 ENCOUNTER — Other Ambulatory Visit: Payer: Self-pay | Admitting: *Deleted

## 2015-08-17 MED ORDER — LEVOTHYROXINE SODIUM 75 MCG PO TABS: 75.0000 ug | ORAL_TABLET | Freq: Every day | ORAL | Status: DC

## 2015-08-22 ENCOUNTER — Telehealth: Payer: Self-pay | Admitting: Endocrinology

## 2015-08-22 NOTE — Telephone Encounter (Signed)
I spoke with patients daughter, I let her know that the new dose of synthroid was already called into her moms pharmacy.

## 2015-08-22 NOTE — Telephone Encounter (Signed)
Lynden AngCathy would like to speak with you about pts lab results

## 2015-09-03 ENCOUNTER — Encounter (HOSPITAL_COMMUNITY): Payer: Self-pay | Admitting: Physical Medicine and Rehabilitation

## 2015-09-03 ENCOUNTER — Emergency Department (HOSPITAL_COMMUNITY)
Admission: EM | Admit: 2015-09-03 | Discharge: 2015-09-03 | Disposition: A | Discharge status: Home / Self Care | Payer: Medicare Other | Attending: Emergency Medicine | Admitting: Emergency Medicine

## 2015-09-03 ENCOUNTER — Emergency Department (HOSPITAL_COMMUNITY): Payer: Medicare Other

## 2015-09-03 DIAGNOSIS — F0390 Unspecified dementia without behavioral disturbance: Secondary | ICD-10-CM | POA: Diagnosis not present

## 2015-09-03 DIAGNOSIS — J189 Pneumonia, unspecified organism: Secondary | ICD-10-CM

## 2015-09-03 DIAGNOSIS — J159 Unspecified bacterial pneumonia: Secondary | ICD-10-CM | POA: Diagnosis not present

## 2015-09-03 DIAGNOSIS — Z7982 Long term (current) use of aspirin: Secondary | ICD-10-CM | POA: Insufficient documentation

## 2015-09-03 DIAGNOSIS — Z79899 Other long term (current) drug therapy: Secondary | ICD-10-CM | POA: Insufficient documentation

## 2015-09-03 DIAGNOSIS — Z880 Allergy status to penicillin: Secondary | ICD-10-CM | POA: Insufficient documentation

## 2015-09-03 DIAGNOSIS — R4182 Altered mental status, unspecified: Secondary | ICD-10-CM | POA: Diagnosis present

## 2015-09-03 DIAGNOSIS — E079 Disorder of thyroid, unspecified: Secondary | ICD-10-CM | POA: Diagnosis not present

## 2015-09-03 LAB — COMPREHENSIVE METABOLIC PANEL
ALT: 21 U/L (ref 14–54)
AST: 61 U/L — AB (ref 15–41)
Albumin: 4 g/dL (ref 3.5–5.0)
Alkaline Phosphatase: 80 U/L (ref 38–126)
Anion gap: 15 (ref 5–15)
BILIRUBIN TOTAL: 0.7 mg/dL (ref 0.3–1.2)
BUN: 18 mg/dL (ref 6–20)
CO2: 25 mmol/L (ref 22–32)
Calcium: 8.9 mg/dL (ref 8.9–10.3)
Chloride: 96 mmol/L — ABNORMAL LOW (ref 101–111)
Creatinine, Ser: 1.19 mg/dL — ABNORMAL HIGH (ref 0.44–1.00)
GFR calc non Af Amer: 40 mL/min — ABNORMAL LOW (ref >60)
GFR, EST AFRICAN AMERICAN: 47 mL/min — AB (ref >60)
Glucose, Bld: 95 mg/dL (ref 65–99)
POTASSIUM: 4.7 mmol/L (ref 3.5–5.1)
Sodium: 136 mmol/L (ref 135–145)
TOTAL PROTEIN: 7.6 g/dL (ref 6.5–8.1)

## 2015-09-03 LAB — CBC WITH DIFFERENTIAL/PLATELET
Basophils Absolute: 0 10*3/uL (ref 0.0–0.1)
Basophils Relative: 0 %
EOS PCT: 0 %
Eosinophils Absolute: 0 10*3/uL (ref 0.0–0.7)
HCT: 40.6 % (ref 36.0–46.0)
Hemoglobin: 14.1 g/dL (ref 12.0–15.0)
LYMPHS ABS: 1 10*3/uL (ref 0.7–4.0)
Lymphocytes Relative: 12 %
MCH: 28.9 pg (ref 26.0–34.0)
MCHC: 34.7 g/dL (ref 30.0–36.0)
MCV: 83.2 fL (ref 78.0–100.0)
Monocytes Absolute: 0.6 10*3/uL (ref 0.1–1.0)
Monocytes Relative: 8 %
NEUTROS ABS: 6.5 10*3/uL (ref 1.7–7.7)
Neutrophils Relative %: 80 %
Platelets: 267 10*3/uL (ref 150–400)
RBC: 4.88 MIL/uL (ref 3.87–5.11)
RDW: 13 % (ref 11.5–15.5)
WBC: 8.1 10*3/uL (ref 4.0–10.5)

## 2015-09-03 LAB — URINE MICROSCOPIC-ADD ON: WBC, UA: NONE SEEN WBC/hpf (ref 0–5)

## 2015-09-03 LAB — URINALYSIS, ROUTINE W REFLEX MICROSCOPIC
Bilirubin Urine: NEGATIVE
Glucose, UA: NEGATIVE mg/dL
Ketones, ur: NEGATIVE mg/dL
Leukocytes, UA: NEGATIVE
Nitrite: NEGATIVE
PROTEIN: 100 mg/dL — AB
Specific Gravity, Urine: 1.016 (ref 1.005–1.030)
pH: 6.5 (ref 5.0–8.0)

## 2015-09-03 LAB — TSH: TSH: 5.242 u[IU]/mL — ABNORMAL HIGH (ref 0.350–4.500)

## 2015-09-03 LAB — CBG MONITORING, ED: Glucose-Capillary: 96 mg/dL (ref 65–99)

## 2015-09-03 LAB — I-STAT TROPONIN, ED: Troponin i, poc: 0.02 ng/mL (ref 0.00–0.08)

## 2015-09-03 MED ORDER — DEXTROSE 5 % IV SOLN
500.0000 mg | Freq: Once | INTRAVENOUS | Status: AC
  Administered 2015-09-03: 500 mg via INTRAVENOUS
  Filled 2015-09-03: qty 500

## 2015-09-03 MED ORDER — SODIUM CHLORIDE 0.9 % IV BOLUS (SEPSIS)
500.0000 mL | Freq: Once | INTRAVENOUS | Status: AC
  Administered 2015-09-03: 500 mL via INTRAVENOUS

## 2015-09-03 MED ORDER — CEFTRIAXONE SODIUM 1 G IJ SOLR
1.0000 g | Freq: Once | INTRAMUSCULAR | Status: AC
  Administered 2015-09-03: 1 g via INTRAVENOUS
  Filled 2015-09-03: qty 10

## 2015-09-03 MED ORDER — SODIUM CHLORIDE 0.9 % IV BOLUS (SEPSIS)
1000.0000 mL | Freq: Once | INTRAVENOUS | Status: AC
  Administered 2015-09-03: 1000 mL via INTRAVENOUS

## 2015-09-03 MED ORDER — DOXYCYCLINE HYCLATE 100 MG PO CAPS: 100.0000 mg | ORAL_CAPSULE | Freq: Two times a day (BID) | ORAL | Status: DC

## 2015-09-03 NOTE — ED Notes (Signed)
Attempted to get lab work. Unsuccessful. Phlebotomy called.

## 2015-09-03 NOTE — ED Notes (Signed)
CBG 96 in triage. 

## 2015-09-03 NOTE — ED Provider Notes (Signed)
CSN: [id number]     Arrival date & time 09/03/15  1029 History   First MD Initiated Contact with Patient 09/03/15 1057     Chief Complaint  Patient presents with  . Altered Mental Status     (Consider location/radiation/quality/duration/timing/severity/associated sxs/prior Treatment) HPI.Marland KitchenMarland KitchenMarland KitchenLevel V caveat for dementia. Patient normally has altered mental status, but today she was found lying on the bathroom floor. She is more confused than usual. She lives with her children. No prodromal illnesses. Review systems positive for low-grade cough. No obvious dysuria, chest pain, dyspnea, dehydration, stiff neck  Past Medical History  Diagnosis Date  . Thyroid disease   . Tremor   . Dementia    Past Surgical History  Procedure Laterality Date  . Thyroid surgery  1960   Family History  Problem Relation Age of Onset  . Hypertension Mother   . Hypertension Father    Social History  Substance Use Topics  . Smoking status: Never Smoker   . Smokeless tobacco: Never Used  . Alcohol Use: No   OB History    No data available     Review of Systems  Reason unable to perform ROS: Dementia.      Allergies  Methimazole and Penicillins  Home Medications   Prior to Admission medications   Medication Sig Start Date End Date Taking? Authorizing Provider  aspirin 325 MG tablet Take 325 mg by mouth at bedtime.    Yes Historical Provider, MD  chlordiazePOXIDE (LIBRIUM) 5 MG capsule Take 5 mg by mouth at bedtime.    Yes Historical Provider, MD  Cholecalciferol (VITAMIN D3) 50000 UNITS TABS Take 5,000 Units by mouth at bedtime.   Yes Historical Provider, MD  Cyanocobalamin (B-12) 2500 MCG TABS Take 2,500 mcg by mouth daily.   Yes Historical Provider, MD  donepezil (ARICEPT) 10 MG tablet Take 10 mg by mouth at bedtime.    Yes Historical Provider, MD  levothyroxine (SYNTHROID, LEVOTHROID) 75 MCG tablet Take 1 tablet (75 mcg total) by mouth daily. 08/17/15  Yes Reather Littler, MD  QUEtiapine  (SEROQUEL) 25 MG tablet Take 25 mg by mouth 3 (three) times daily.    Yes Historical Provider, MD  doxycycline (VIBRAMYCIN) 100 MG capsule Take 1 capsule (100 mg total) by mouth 2 (two) times daily. 09/03/15   Donnetta Hutching, MD   BP 117/105 mmHg  Pulse 93  Temp(Src) 99.1 F (37.3 C) (Oral)  Resp 19  Ht 5' (1.524 m)  Wt 132 lb (59.875 kg)  BMI 25.78 kg/m2  SpO2 97% Physical Exam  Constitutional:  Confused  HENT:  Head: Normocephalic and atraumatic.  Eyes: Conjunctivae are normal. Pupils are equal, round, and reactive to light.  Neck: Normal range of motion. Neck supple.  Cardiovascular: Normal rate and regular rhythm.   Pulmonary/Chest: Effort normal and breath sounds normal.  Abdominal: Soft. Bowel sounds are normal.  Musculoskeletal:  No bony tenderness  Neurological:  Moving all extremities  Skin: Skin is warm and dry.  Psychiatric:  Flat affect  Nursing note and vitals reviewed.   ED Course  Procedures (including critical care time) Labs Review Labs Reviewed  COMPREHENSIVE METABOLIC PANEL - Abnormal; Notable for the following:    Chloride 96 (*)    Creatinine, Ser 1.19 (*)    AST 61 (*)    GFR calc non Af Amer 40 (*)    GFR calc Af Amer 47 (*)    All other components within normal limits  URINALYSIS, ROUTINE W REFLEX MICROSCOPIC (NOT AT  [facility]) - Abnormal; Notable for the following:    Hgb urine dipstick LARGE (*)    Protein, ur 100 (*)    All other components within normal limits  TSH - Abnormal; Notable for the following:    TSH 5.242 (*)    All other components within normal limits  URINE MICROSCOPIC-ADD ON - Abnormal; Notable for the following:    Squamous Epithelial / LPF 0-5 (*)    Bacteria, UA RARE (*)    Casts HYALINE CASTS (*)    All other components within normal limits  CBC WITH DIFFERENTIAL/PLATELET  I-STAT TROPOININ, ED  CBG MONITORING, ED    Imaging Review Dg Chest 2 View  09/03/2015  CLINICAL DATA:  Altered mental status. EXAM: CHEST  2 VIEW  COMPARISON:  05/02/2014 FINDINGS: New densities at the left lung base. Findings are suggestive for a pleural fluid based on the lateral view. Peribronchial thickening in the right suprahilar region appears are chronic. Heart size is grossly stable. Negative for pneumothorax. IMPRESSION: New densities at the left lung base. Findings may represent a combination of pleural fluid and consolidation. Electronically Signed   By: Richarda OverlieAdam  Henn M.D.   On: 09/03/2015 11:58   I have personally reviewed and evaluated these images and lab results as part of my medical decision-making.   EKG Interpretation   Date/Time:  Saturday September 03 2015 10:41:42 EDT Ventricular Rate:  94 PR Interval:    QRS Duration: 80 QT Interval:  386 QTC Calculation: 482 R Axis:   71 Text Interpretation:  Accelerated Junctional rhythm Abnormal ECG Confirmed  by Author Hatlestad  MD, Liora Myles ([id number]) on 09/03/2015 12:26:49 PM      MDM   Final diagnoses:  CAP (community acquired pneumonia)    Chest x-ray reveals new densities in the left lung base. I suspect early pneumonia. IV Zithromax, IV Rocephin, IV fluids. Discussed findings with the children of the patient. They agreed to take the patient home and treat her as an outpatient. Discharge antibiotic doxycycline 100 mg. I will not use Zithromax or fluoroquinolone secondary to potential drug interactions.    Donnetta HutchingBrian Jerica Creegan, MD 09/03/15 [phone]

## 2015-09-03 NOTE — ED Notes (Signed)
Family reports she found pt laying in urine on bathroom floor. History of dementia, but pt more confused than normal today. Pt confused, unable to answer questions in triage.

## 2015-09-03 NOTE — ED Notes (Signed)
Pt returned from xray

## 2015-09-03 NOTE — Discharge Instructions (Signed)
Chest x-ray shows a small amount of pneumonia. Increase fluids. Tylenol for fever. Start antibiotic tomorrow morning.

## 2015-11-14 ENCOUNTER — Emergency Department (HOSPITAL_COMMUNITY)
Admission: EM | Admit: 2015-11-14 | Discharge: 2015-11-14 | Disposition: A | Discharge status: Home / Self Care | Payer: Medicare Other | Attending: Emergency Medicine | Admitting: Emergency Medicine

## 2015-11-14 ENCOUNTER — Emergency Department (HOSPITAL_COMMUNITY): Payer: Medicare Other

## 2015-11-14 ENCOUNTER — Encounter (HOSPITAL_COMMUNITY): Payer: Self-pay | Admitting: *Deleted

## 2015-11-14 DIAGNOSIS — Z792 Long term (current) use of antibiotics: Secondary | ICD-10-CM | POA: Insufficient documentation

## 2015-11-14 DIAGNOSIS — Z79899 Other long term (current) drug therapy: Secondary | ICD-10-CM | POA: Diagnosis not present

## 2015-11-14 DIAGNOSIS — N390 Urinary tract infection, site not specified: Secondary | ICD-10-CM

## 2015-11-14 DIAGNOSIS — Z880 Allergy status to penicillin: Secondary | ICD-10-CM | POA: Insufficient documentation

## 2015-11-14 DIAGNOSIS — Z7982 Long term (current) use of aspirin: Secondary | ICD-10-CM | POA: Insufficient documentation

## 2015-11-14 DIAGNOSIS — F0391 Unspecified dementia with behavioral disturbance: Secondary | ICD-10-CM | POA: Diagnosis not present

## 2015-11-14 DIAGNOSIS — R4182 Altered mental status, unspecified: Secondary | ICD-10-CM | POA: Diagnosis present

## 2015-11-14 DIAGNOSIS — E079 Disorder of thyroid, unspecified: Secondary | ICD-10-CM | POA: Insufficient documentation

## 2015-11-14 LAB — CBC WITH DIFFERENTIAL/PLATELET
BASOS ABS: 0 10*3/uL (ref 0.0–0.1)
BASOS PCT: 0 %
Eosinophils Absolute: 0.1 10*3/uL (ref 0.0–0.7)
Eosinophils Relative: 2 %
HEMATOCRIT: 35.7 % — AB (ref 36.0–46.0)
HEMOGLOBIN: 11.5 g/dL — AB (ref 12.0–15.0)
LYMPHS PCT: 27 %
Lymphs Abs: 1.2 10*3/uL (ref 0.7–4.0)
MCH: 27.5 pg (ref 26.0–34.0)
MCHC: 32.2 g/dL (ref 30.0–36.0)
MCV: 85.4 fL (ref 78.0–100.0)
MONO ABS: 0.4 10*3/uL (ref 0.1–1.0)
MONOS PCT: 9 %
NEUTROS ABS: 2.9 10*3/uL (ref 1.7–7.7)
NEUTROS PCT: 62 %
Platelets: 244 10*3/uL (ref 150–400)
RBC: 4.18 MIL/uL (ref 3.87–5.11)
RDW: 14.5 % (ref 11.5–15.5)
WBC: 4.7 10*3/uL (ref 4.0–10.5)

## 2015-11-14 LAB — URINALYSIS, ROUTINE W REFLEX MICROSCOPIC
BILIRUBIN URINE: NEGATIVE
GLUCOSE, UA: NEGATIVE mg/dL
KETONES UR: NEGATIVE mg/dL
Nitrite: NEGATIVE
PROTEIN: NEGATIVE mg/dL
Specific Gravity, Urine: 1.021 (ref 1.005–1.030)
pH: 5.5 (ref 5.0–8.0)

## 2015-11-14 LAB — COMPREHENSIVE METABOLIC PANEL
ALBUMIN: 3.6 g/dL (ref 3.5–5.0)
ALK PHOS: 79 U/L (ref 38–126)
ALT: 12 U/L — ABNORMAL LOW (ref 14–54)
AST: 20 U/L (ref 15–41)
Anion gap: 7 (ref 5–15)
BILIRUBIN TOTAL: 0.6 mg/dL (ref 0.3–1.2)
BUN: 18 mg/dL (ref 6–20)
CALCIUM: 8.8 mg/dL — AB (ref 8.9–10.3)
CO2: 24 mmol/L (ref 22–32)
CREATININE: 1.28 mg/dL — AB (ref 0.44–1.00)
Chloride: 108 mmol/L (ref 101–111)
GFR calc Af Amer: 43 mL/min — ABNORMAL LOW (ref >60)
GFR, EST NON AFRICAN AMERICAN: 37 mL/min — AB (ref >60)
GLUCOSE: 116 mg/dL — AB (ref 65–99)
POTASSIUM: 3.3 mmol/L — AB (ref 3.5–5.1)
Sodium: 139 mmol/L (ref 135–145)
TOTAL PROTEIN: 6.6 g/dL (ref 6.5–8.1)

## 2015-11-14 LAB — URINE MICROSCOPIC-ADD ON

## 2015-11-14 MED ORDER — SULFAMETHOXAZOLE-TRIMETHOPRIM 800-160 MG PO TABS
1.0000 | ORAL_TABLET | Freq: Once | ORAL | Status: AC
  Administered 2015-11-14: 1 via ORAL
  Filled 2015-11-14: qty 1

## 2015-11-14 MED ORDER — SULFAMETHOXAZOLE-TRIMETHOPRIM 800-160 MG PO TABS: 1.0000 | ORAL_TABLET | Freq: Two times a day (BID) | ORAL | Status: AC

## 2015-11-14 NOTE — ED Notes (Signed)
EDP at bedside  

## 2015-11-14 NOTE — ED Notes (Signed)
Daughter states she has noticed that her Mother went to bed tonight and everything seemed fine and then her Mother started yelling for the son.  Daughter went into the room and found her Mother in her clothing.  Put her in the bed with her and Mother got up and unknown if she hit the dresser with her face but the right eye is bruised and discolored  Patient talking making sense but appears to be confused

## 2015-11-14 NOTE — ED Provider Notes (Signed)
CSN: [id number]     Arrival date & time 11/14/15  0002 History  By signing my name below, I, [name], attest that this documentation has been prepared under the direction and in the presence of Haydon Kalmar, MD.  Electronically Signed: Gillis Ends. Lyn Hollingshead, ED Scribe. 11/14/2015. 4:08 AM.    Chief Complaint  Patient presents with  . Altered Mental Status   LEVEL 5 CAVEAT - ALTERED MENTAL STATUS Patient is a 82 y.o. female presenting with altered mental status. The history is provided by the patient and a relative. No language interpreter was used.  Altered Mental Status Presenting symptoms: behavior changes   Severity:  Moderate Most recent episode:  Today Episode history:  Multiple Progression:  Worsening Chronicity:  Recurrent Context: dementia     HPI Comments: Brittney Yang is a 82 y.o. female with PMHx of Dementia who presents to the Emergency Department s/p fall that occurred PTA. She fell in living room. Daughter is unsure of head injury or LOC. Per patient's daughter, patient also has worsening altered mental status from baseline. Pt had numerous screaming fits at home. Her daughter notes that combination of Librium, Aricept, and Seroquel is not causing desired effects, questioning possible medicine adjustment. However, she is compliant with her medicine. Pt's daughter also reports that prior Haldol prescription gave patient Parkinsonian symptoms.  Past Medical History  Diagnosis Date  . Thyroid disease   . Tremor   . Dementia    Past Surgical History  Procedure Laterality Date  . Thyroid surgery  1960   Family History  Problem Relation Age of Onset  . Hypertension Mother   . Hypertension Father    Social History  Substance Use Topics  . Smoking status: Never Smoker   . Smokeless tobacco: Never Used  . Alcohol Use: No   OB History    No data available     Review of Systems  Unable to perform ROS: Dementia      Allergies  Methimazole and  Penicillins  Home Medications   Prior to Admission medications   Medication Sig Start Date End Date Taking? Authorizing Provider  aspirin 325 MG tablet Take 325 mg by mouth at bedtime.     Historical Provider, MD  chlordiazePOXIDE (LIBRIUM) 5 MG capsule Take 5 mg by mouth at bedtime.     Historical Provider, MD  Cholecalciferol (VITAMIN D3) 50000 UNITS TABS Take 5,000 Units by mouth at bedtime.    Historical Provider, MD  Cyanocobalamin (B-12) 2500 MCG TABS Take 2,500 mcg by mouth daily.    Historical Provider, MD  donepezil (ARICEPT) 10 MG tablet Take 10 mg by mouth at bedtime.     Historical Provider, MD  doxycycline (VIBRAMYCIN) 100 MG capsule Take 1 capsule (100 mg total) by mouth 2 (two) times daily. 09/03/15   Donnetta Hutching, MD  levothyroxine (SYNTHROID, LEVOTHROID) 75 MCG tablet Take 1 tablet (75 mcg total) by mouth daily. 08/17/15   Reather Littler, MD  QUEtiapine (SEROQUEL) 25 MG tablet Take 25 mg by mouth 3 (three) times daily.     Historical Provider, MD   BP 122/72 mmHg  Pulse 86  Temp(Src) 98.1 F (36.7 C) (Oral)  Resp 16  SpO2 96% Physical Exam  Constitutional: She appears well-developed and well-nourished. No distress.  HENT:  Head: Normocephalic and atraumatic.  Right Ear: No hemotympanum.  Left Ear: No hemotympanum.  Mouth/Throat: Oropharynx is clear and moist. No oropharyngeal exudate.  Moist mucous membranes. Medial, scant bruising on right nose  Eyes: Conjunctivae are normal. Pupils are equal, round, and reactive to light.  No Battle Signs, No Raccoon Eyes.   Neck: Normal range of motion. Neck supple. No JVD present.  Trachea midline  Cardiovascular: Normal rate, regular rhythm and normal heart sounds.   Pulmonary/Chest: Effort normal and breath sounds normal. No respiratory distress.  Abdominal: Soft. Bowel sounds are normal. She exhibits no distension. There is no tenderness. There is no rebound and no guarding.  Musculoskeletal: Normal range of motion.  Neurological:  She is alert. She has normal reflexes.  Skin: Skin is warm and dry.  Psychiatric: She has a normal mood and affect. Her behavior is normal.  Nursing note and vitals reviewed.   ED Course  Procedures (including critical care time) DIAGNOSTIC STUDIES: Oxygen Saturation is 96% on RA, normal by my interpretation.    COORDINATION OF CARE: 3:45 AM-Discussed treatment plan which includes imaging, blood work and UA with pt and daughter at bedside and pt and daughter agreed to plan.    Labs Review Labs Reviewed  CBC WITH DIFFERENTIAL/PLATELET - Abnormal; Notable for the following:    Hemoglobin 11.5 (*)    HCT 35.7 (*)    All other components within normal limits  COMPREHENSIVE METABOLIC PANEL - Abnormal; Notable for the following:    Potassium 3.3 (*)    Glucose, Bld 116 (*)    Creatinine, Ser 1.28 (*)    Calcium 8.8 (*)    ALT 12 (*)    GFR calc non Af Amer 37 (*)    GFR calc Af Amer 43 (*)    All other components within normal limits  URINALYSIS, ROUTINE W REFLEX MICROSCOPIC (NOT AT [facility]) - Abnormal; Notable for the following:    APPearance CLOUDY (*)    Hgb urine dipstick TRACE (*)    Leukocytes, UA LARGE (*)    All other components within normal limits  URINE MICROSCOPIC-ADD ON - Abnormal; Notable for the following:    Squamous Epithelial / LPF 0-5 (*)    Bacteria, UA RARE (*)    All other components within normal limits    Imaging Review No results found. I have personally reviewed and evaluated these images and lab results as part of my medical decision-making.   EKG Interpretation None      MDM   Final diagnoses:  None   Filed Vitals:   11/14/15 0017 11/14/15 0400  BP: 122/72 178/92  Pulse: 86 78  Temp: 98.1 F (36.7 C)   Resp: 16     Results for orders placed or performed during the hospital encounter of 11/14/15  CBC with Differential  Result Value Ref Range   WBC 4.7 4.0 - 10.5 K/uL   RBC 4.18 3.87 - 5.11 MIL/uL   Hemoglobin 11.5 (L) 12.0 - 15.0  g/dL   HCT 16.1 (L) 09.6 - 04.5 %   MCV 85.4 78.0 - 100.0 fL   MCH 27.5 26.0 - 34.0 pg   MCHC 32.2 30.0 - 36.0 g/dL   RDW 40.9 81.1 - 91.4 %   Platelets 244 150 - 400 K/uL   Neutrophils Relative % 62 %   Neutro Abs 2.9 1.7 - 7.7 K/uL   Lymphocytes Relative 27 %   Lymphs Abs 1.2 0.7 - 4.0 K/uL   Monocytes Relative 9 %   Monocytes Absolute 0.4 0.1 - 1.0 K/uL   Eosinophils Relative 2 %   Eosinophils Absolute 0.1 0.0 - 0.7 K/uL   Basophils Relative 0 %   Basophils  Absolute 0.0 0.0 - 0.1 K/uL  Comprehensive metabolic panel  Result Value Ref Range   Sodium 139 135 - 145 mmol/L   Potassium 3.3 (L) 3.5 - 5.1 mmol/L   Chloride 108 101 - 111 mmol/L   CO2 24 22 - 32 mmol/L   Glucose, Bld 116 (H) 65 - 99 mg/dL   BUN 18 6 - 20 mg/dL   Creatinine, Ser 4.091.28 (H) 0.44 - 1.00 mg/dL   Calcium 8.8 (L) 8.9 - 10.3 mg/dL   Total Protein 6.6 6.5 - 8.1 g/dL   Albumin 3.6 3.5 - 5.0 g/dL   AST 20 15 - 41 U/L   ALT 12 (L) 14 - 54 U/L   Alkaline Phosphatase 79 38 - 126 U/L   Total Bilirubin 0.6 0.3 - 1.2 mg/dL   GFR calc non Af Amer 37 (L) >60 mL/min   GFR calc Af Amer 43 (L) >60 mL/min   Anion gap 7 5 - 15  Urinalysis, Routine w reflex microscopic (not [facility])  Result Value Ref Range   Color, Urine YELLOW YELLOW   APPearance CLOUDY (A) CLEAR   Specific Gravity, Urine 1.021 1.005 - 1.030   pH 5.5 5.0 - 8.0   Glucose, UA NEGATIVE NEGATIVE mg/dL   Hgb urine dipstick TRACE (A) NEGATIVE   Bilirubin Urine NEGATIVE NEGATIVE   Ketones, ur NEGATIVE NEGATIVE mg/dL   Protein, ur NEGATIVE NEGATIVE mg/dL   Nitrite NEGATIVE NEGATIVE   Leukocytes, UA LARGE (A) NEGATIVE  Urine microscopic-add on  Result Value Ref Range   Squamous Epithelial / LPF 0-5 (A) NONE SEEN   WBC, UA 6-30 0 - 5 WBC/hpf   RBC / HPF 0-5 0 - 5 RBC/hpf   Bacteria, UA RARE (A) NONE SEEN   Ct Head Wo Contrast  11/14/2015  CLINICAL DATA:  Fall tonight.  Head injury. EXAM: CT HEAD WITHOUT CONTRAST TECHNIQUE: Contiguous axial images were  obtained from the base of the skull through the vertex without intravenous contrast. COMPARISON:  Head CT 05/02/2014 FINDINGS: Atrophy is mildly progressed. Mild chronic small vessel ischemia is unchanged.No intracranial hemorrhage, mass effect, or midline shift. No hydrocephalus. The basilar cisterns are patent. No evidence of territorial infarct. No intracranial fluid collection. Calvarium is intact. Included paranasal sinuses and mastoid air cells are well aerated. IMPRESSION: 1.  No acute intracranial abnormality. 2. Progressive atrophy from 2015. Chronic small vessel ischemia is stable. Electronically Signed   By: Rubye OaksMelanie  Ehinger M.D.   On: 11/14/2015 05:44    Will need to discuss changes in medication with your PMD and neurologist.  Will start bactrim BID x 7 days for UTI.  As this has been going on x2 years I doubt the UTI is the cause of patient's current issue.  Strict return precautions given   I personally performed the services described in this documentation, which was scribed in my presence. The recorded information has been reviewed and is accurate.       Cy BlamerApril Zhion Pevehouse, MD 11/14/15 [phone]

## 2015-11-23 ENCOUNTER — Other Ambulatory Visit: Payer: Self-pay

## 2015-11-23 MED ORDER — LEVOTHYROXINE SODIUM 75 MCG PO TABS: 75.0000 ug | ORAL_TABLET | Freq: Every day | ORAL | Status: DC

## 2016-02-13 ENCOUNTER — Encounter: Payer: Self-pay | Admitting: Endocrinology

## 2016-02-13 ENCOUNTER — Ambulatory Visit (INDEPENDENT_AMBULATORY_CARE_PROVIDER_SITE_OTHER): Payer: Medicare Other | Admitting: Endocrinology

## 2016-02-13 VITALS — BP 127/94 | HR 80 | Ht 60.0 in | Wt 130.0 lb

## 2016-02-13 DIAGNOSIS — E559 Vitamin D deficiency, unspecified: Secondary | ICD-10-CM | POA: Diagnosis not present

## 2016-02-13 DIAGNOSIS — E890 Postprocedural hypothyroidism: Secondary | ICD-10-CM

## 2016-02-13 NOTE — Progress Notes (Signed)
Patient ID: Brittney Yang, female   DOB: [birth year], 82 y.o.   MRN: [id number]   Reason for Appointment:  Hypothyroidism, followup visit    History of Present Illness:   The hypothyroidism was first diagnosed in 08/2011 She had required 2 doses of radioactive iodine for her hyperthyroidism before she became hypothyroid Did not have any significant symptoms at the onset of hypothyroidism and her hypothyroidism has been mild  Has been taking relatively low doses of supplement of levothyroxine  In 1/16 TSH she was told to increase her dose to half of the 125 g She currently in 2/17 her dose was increased to 75 g when her TSH was 9.6  Her daughter did not notice any change in her after the last increase Patient is unable to give any history again Her daughter does not report any new problems or changes except some difficulty swallowing   Wt Readings from Last 3 Encounters:  02/13/16 130 lb (59 kg)  09/03/15 132 lb (59.9 kg)  08/15/15 131 lb 6.4 oz (59.6 kg)     Lab Results  Component Value Date   TSH 5.242 (H) 09/03/2015   TSH 9.65 (H) 08/15/2015   TSH 4.69 (H) 02/11/2015   FREET4 0.55 (L) 08/15/2015   FREET4 0.64 02/11/2015   FREET4 0.97 09/21/2014        Medication List       Accurate as of 02/13/16 10:23 AM. Always use your most recent med list.          aspirin 325 MG tablet Take 325 mg by mouth at bedtime.   B-12 2500 MCG Tabs Take 2,500 mcg by mouth daily.   chlordiazePOXIDE 5 MG capsule Commonly known as:  LIBRIUM Take 5 mg by mouth at bedtime.   donepezil 10 MG tablet Commonly known as:  ARICEPT Take 10 mg by mouth at bedtime.   doxylamine (Sleep) 25 MG tablet Commonly known as:  UNISOM Take 12.5 mg by mouth at bedtime as needed for sleep.   levothyroxine 75 MCG tablet Commonly known as:  SYNTHROID, LEVOTHROID Take 1 tablet (75 mcg total) by mouth daily.   QUEtiapine 25 MG tablet Commonly known as:  SEROQUEL Take 25 mg by  mouth 3 (three) times daily.   Vitamin D3 50000 units Tabs Take 5,000 Units by mouth at bedtime.       Past Medical History:  Diagnosis Date  . Dementia   . Thyroid disease   . Tremor     Past Surgical History:  Procedure Laterality Date  . THYROID SURGERY  1960    Family History  Problem Relation Age of Onset  . Hypertension Mother   . Hypertension Father     Social History:  reports that she has never smoked. She has never used smokeless tobacco. She reports that she does not drink alcohol or use drugs.  Allergies:  Allergies  Allergen Reactions  . Methimazole [Methimazole] Anaphylaxis  . Penicillins Hives   REVIEW of systems:   She has ? Parkinson's She has had dementia and is on Aricept and Seroquel  She has had a history of osteoporosis previously treated with Fosamax.    VITAMIN D deficiency: her vitamin D level was only about 13 and she is on supplements    Examination:   BP (!) 127/94   Pulse 80   Ht 5' (1.524 m)   Wt 130 lb (59 kg)   BMI 25.39 kg/m   She is looking well and conversing  Deep tendon reflexes 2+ bilaterally at biceps. No peripheral edema or skin changes    Assessments   Hypothyroidism, post ablative and requiring relatively low doses of levothyroxine, now taking 75 g Clinically doing well Will need to reassess her TSH today and adjust dosage accordingly, her last level was about 5 which was adequate for her age  Vitamin D deficiency: Needs follow-up    Louanna Vanliew 02/13/2016, 10:23 AM

## 2016-02-14 ENCOUNTER — Other Ambulatory Visit (INDEPENDENT_AMBULATORY_CARE_PROVIDER_SITE_OTHER): Payer: Medicare Other

## 2016-02-14 DIAGNOSIS — E559 Vitamin D deficiency, unspecified: Secondary | ICD-10-CM

## 2016-02-14 DIAGNOSIS — E890 Postprocedural hypothyroidism: Secondary | ICD-10-CM | POA: Diagnosis not present

## 2016-02-14 LAB — T4, FREE: FREE T4: 0.57 ng/dL — AB (ref 0.60–1.60)

## 2016-02-14 LAB — TSH: TSH: 5.66 u[IU]/mL — ABNORMAL HIGH (ref 0.35–4.50)

## 2016-02-14 LAB — VITAMIN D 25 HYDROXY (VIT D DEFICIENCY, FRACTURES): VITD: 36.07 ng/mL (ref 30.00–100.00)

## 2016-02-14 NOTE — Progress Notes (Signed)
Please let patient know that the thyroid result is adequate for her age, continue same dose

## 2016-02-15 ENCOUNTER — Other Ambulatory Visit: Payer: Self-pay | Admitting: Family Medicine

## 2016-02-15 DIAGNOSIS — R1313 Dysphagia, pharyngeal phase: Secondary | ICD-10-CM

## 2016-02-17 ENCOUNTER — Ambulatory Visit
Admission: RE | Admit: 2016-02-17 | Discharge: 2016-02-17 | Disposition: A | Discharge status: Home / Self Care | Payer: Medicare Other | Source: Ambulatory Visit | Attending: Family Medicine | Admitting: Family Medicine

## 2016-02-17 DIAGNOSIS — R1313 Dysphagia, pharyngeal phase: Secondary | ICD-10-CM

## 2016-02-24 ENCOUNTER — Encounter (HOSPITAL_COMMUNITY): Payer: Self-pay

## 2016-02-24 ENCOUNTER — Emergency Department (HOSPITAL_COMMUNITY)
Admission: EM | Admit: 2016-02-24 | Discharge: 2016-02-24 | Disposition: A | Discharge status: Home / Self Care | Payer: Medicare Other | Attending: Emergency Medicine | Admitting: Emergency Medicine

## 2016-02-24 ENCOUNTER — Emergency Department (HOSPITAL_COMMUNITY): Payer: Medicare Other

## 2016-02-24 DIAGNOSIS — W2201XA Walked into wall, initial encounter: Secondary | ICD-10-CM | POA: Insufficient documentation

## 2016-02-24 DIAGNOSIS — Y999 Unspecified external cause status: Secondary | ICD-10-CM | POA: Insufficient documentation

## 2016-02-24 DIAGNOSIS — E039 Hypothyroidism, unspecified: Secondary | ICD-10-CM | POA: Insufficient documentation

## 2016-02-24 DIAGNOSIS — W19XXXA Unspecified fall, initial encounter: Secondary | ICD-10-CM

## 2016-02-24 DIAGNOSIS — Z7982 Long term (current) use of aspirin: Secondary | ICD-10-CM | POA: Diagnosis not present

## 2016-02-24 DIAGNOSIS — Y939 Activity, unspecified: Secondary | ICD-10-CM | POA: Diagnosis not present

## 2016-02-24 DIAGNOSIS — F0390 Unspecified dementia without behavioral disturbance: Secondary | ICD-10-CM | POA: Insufficient documentation

## 2016-02-24 DIAGNOSIS — Y9289 Other specified places as the place of occurrence of the external cause: Secondary | ICD-10-CM | POA: Insufficient documentation

## 2016-02-24 LAB — BASIC METABOLIC PANEL
ANION GAP: 9 (ref 5–15)
BUN: 20 mg/dL (ref 6–20)
CALCIUM: 9 mg/dL (ref 8.9–10.3)
CO2: 25 mmol/L (ref 22–32)
CREATININE: 1.35 mg/dL — AB (ref 0.44–1.00)
Chloride: 105 mmol/L (ref 101–111)
GFR, EST AFRICAN AMERICAN: 40 mL/min — AB (ref >60)
GFR, EST NON AFRICAN AMERICAN: 34 mL/min — AB (ref >60)
GLUCOSE: 96 mg/dL (ref 65–99)
Potassium: 4.3 mmol/L (ref 3.5–5.1)
Sodium: 139 mmol/L (ref 135–145)

## 2016-02-24 LAB — CBC
HEMATOCRIT: 41.4 % (ref 36.0–46.0)
HEMOGLOBIN: 13.4 g/dL (ref 12.0–15.0)
MCH: 28.3 pg (ref 26.0–34.0)
MCHC: 32.4 g/dL (ref 30.0–36.0)
MCV: 87.3 fL (ref 78.0–100.0)
Platelets: 216 10*3/uL (ref 150–400)
RBC: 4.74 MIL/uL (ref 3.87–5.11)
RDW: 15.8 % — ABNORMAL HIGH (ref 11.5–15.5)
WBC: 7.1 10*3/uL (ref 4.0–10.5)

## 2016-02-24 LAB — URINALYSIS, ROUTINE W REFLEX MICROSCOPIC
BILIRUBIN URINE: NEGATIVE
GLUCOSE, UA: NEGATIVE mg/dL
KETONES UR: NEGATIVE mg/dL
LEUKOCYTES UA: NEGATIVE
Nitrite: NEGATIVE
PROTEIN: NEGATIVE mg/dL
Specific Gravity, Urine: 1.015 (ref 1.005–1.030)
pH: 7.5 (ref 5.0–8.0)

## 2016-02-24 LAB — URINE MICROSCOPIC-ADD ON

## 2016-02-24 NOTE — ED Notes (Signed)
Pt has been on the bed pan 3 different times and is unable to provide a urine sample. Family stated she is "doing this on purpose" and "you will have to cath her to get pee."

## 2016-02-24 NOTE — Discharge Instructions (Signed)
Please read and follow all provided instructions.  Your diagnoses today include:  1. Fall, initial encounter     Tests performed today include: Vital signs. See below for your results today.   Medications prescribed:  Take as prescribed   Home care instructions:  Follow any educational materials contained in this packet.  Follow-up instructions: Please follow-up with your primary care provider for further evaluation of symptoms and treatment   Return instructions:  Please return to the Emergency Department if you do not get better, if you get worse, or new symptoms OR  - Fever (temperature greater than 101.46F)  - Bleeding that does not stop with holding pressure to the area    -Severe pain (please note that you may be more sore the day after your accident)  - Chest Pain  - Difficulty breathing  - Severe nausea or vomiting  - Inability to tolerate food and liquids  - Passing out  - Skin becoming red around your wounds  - Change in mental status (confusion or lethargy)  - New numbness or weakness    Please return if you have any other emergent concerns.  Additional Information:  Your vital signs today were: BP 184/86    Pulse 100    Resp (!) 30    SpO2 96%  If your blood pressure (BP) was elevated above 135/85 this visit, please have this repeated by your doctor within one month. ---------------

## 2016-02-24 NOTE — ED Notes (Signed)
Pt. Going home with family. Pt. In a wheelchair and leaving with minimal pain and no more questions

## 2016-02-24 NOTE — ED Provider Notes (Signed)
[facility]-EMERGENCY DEPT Provider Note   CSN: [id number] Arrival date & time: 02/24/16  1043     History   Chief Complaint No chief complaint on file.   HPI Brittney Yang is a 82 y.o. female.  HPI  82 y.o. female with a hx of Dementia, presents to the Emergency Department today due to fall this AM. HPI provided by daughter due to dementia. Per daughter, Brittney Yang was ambulating in hallway from bathroom when the daughter heard a fall. Unwitnessed. Daughter noted hole in wall and unsure of head trauma or shoulder. [employer] department palpated neck and Brittney Yang screamed in pain. No pain currently. Unsure of syncope vs mechanical fall. Brittney Yang has hx of mechanical falls due to infection and medication reactions. Daughter notes that Brittney Yang is at her baseline currently. Brittney Yang does not take blood thinners.     Seen in ED on 11-14-15, 09-03-15, 09-22-14 for same.   Level V Caveat: Dementia  Past Medical History:  Diagnosis Date  . Dementia   . Thyroid disease   . Tremor     Patient Active Problem List   Diagnosis Date Noted  . Hypothyroidism, postradioiodine therapy 07/05/2014  . Hypotension 05/02/2014  . Hypothermia 05/02/2014  . 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 Dementia without behavioral disturbance 01/01/2014  . Postsurgical hypothyroidism 03/31/2013    Past Surgical History:  Procedure Laterality Date  . THYROID SURGERY  1960    OB History    No data available       Home Medications    Prior to Admission medications   Medication Sig Start Date End Date Taking? Authorizing Provider  aspirin 325 MG tablet Take 325 mg by mouth at bedtime.     Historical Provider, MD  chlordiazePOXIDE (LIBRIUM) 5 MG capsule Take 5 mg by mouth at bedtime.     Historical Provider, MD  Cholecalciferol (VITAMIN D3) 50000 UNITS TABS Take 5,000 Units by mouth at bedtime.    Historical Provider, MD  Cyanocobalamin (B-12) 2500 MCG TABS Take 2,500 mcg by mouth daily.    Historical  Provider, MD  donepezil (ARICEPT) 10 MG tablet Take 10 mg by mouth at bedtime.     Historical Provider, MD  doxylamine, Sleep, (UNISOM) 25 MG tablet Take 12.5 mg by mouth at bedtime as needed for sleep.    Historical Provider, MD  levothyroxine (SYNTHROID, LEVOTHROID) 75 MCG tablet Take 1 tablet (75 mcg total) by mouth daily. 11/23/15   Reather Littler, MD  QUEtiapine (SEROQUEL) 100 MG tablet Take 100 mg by mouth QID.    Historical Provider, MD    Family History Family History  Problem Relation Age of Onset  . Hypertension Mother   . Hypertension Father     Social History Social History  Substance Use Topics  . Smoking status: Never Smoker  . Smokeless tobacco: Never Used  . Alcohol use No     Allergies   Methimazole [methimazole] and Penicillins   Review of Systems Review of Systems ROS reviewed and all are negative for acute change except as noted in the HPI.    Physical Exam Updated Vital Signs There were no vitals taken for this visit.  Physical Exam  Constitutional: She is oriented to person, place, and time. Vital signs are normal. She appears well-developed and well-nourished.  HENT:  Head: Normocephalic and atraumatic. Head is without raccoon's eyes, without Battle's sign, without abrasion, without right periorbital erythema and without left periorbital erythema.  Right Ear: Hearing normal.  Left Ear: Hearing normal.  Eyes: Conjunctivae and EOM are normal. Pupils are equal, round, and reactive to light.  Neck: Normal range of motion. Neck supple.  Cardiovascular: Normal rate, regular rhythm, normal heart sounds and intact distal pulses.   No murmur heard. Pulmonary/Chest: Effort normal and breath sounds normal. No respiratory distress. She has no wheezes. She has no rales. She exhibits no tenderness.  Abdominal: Soft. There is no tenderness.  Musculoskeletal:       Right shoulder: Normal.       Left shoulder: Normal.       Right elbow: Normal.      Left elbow:  Normal.       Right hip: Normal.       Left hip: Normal.       Cervical back: Normal.       Thoracic back: Normal.       Lumbar back: Normal.  Neurological: She is alert and oriented to person, place, and time. She has normal strength. No cranial nerve deficit or sensory deficit.  Cranial Nerves:  II: Pupils equal, round, reactive to light III,IV, VI: ptosis not present, extra-ocular motions intact bilaterally  V,VII: smile symmetric, facial light touch sensation equal VIII: hearing grossly normal bilaterally  IX,X: midline uvula rise  XI: bilateral shoulder shrug equal and strong XII: midline tongue extension  Skin: Skin is warm and dry.  Psychiatric: She has a normal mood and affect. Her speech is normal and behavior is normal. Thought content normal.  Nursing note and vitals reviewed.  ED Treatments / Results  Labs (all labs ordered are listed, but only abnormal results are displayed) Labs Reviewed  BASIC METABOLIC PANEL - Abnormal; Notable for the following:       Result Value   Creatinine, Ser 1.35 (*)    GFR calc non Af Amer 34 (*)    GFR calc Af Amer 40 (*)    All other components within normal limits  URINALYSIS, ROUTINE W REFLEX MICROSCOPIC (NOT AT [facility]) - Abnormal; Notable for the following:    Hgb urine dipstick SMALL (*)    All other components within normal limits  CBC - Abnormal; Notable for the following:    RDW 15.8 (*)    All other components within normal limits  URINE MICROSCOPIC-ADD ON - Abnormal; Notable for the following:    Squamous Epithelial / LPF 0-5 (*)    Bacteria, UA RARE (*)    All other components within normal limits  CBG MONITORING, ED    EKG  EKG Interpretation  Date/Time:  Friday February 24 2016 10:54:24 EDT Ventricular Rate:  75 PR Interval:    QRS Duration: 126 QT Interval:  410 QTC Calculation: 455 R Axis:   38 Text Interpretation:  Sinus or ectopic atrial rhythm Borderline prolonged PR interval Nonspecific intraventricular  conduction delay wandering baseline Artifact needs repeat Confirmed by Manus Gunning  MD, Jeannett Senior ([id number]) on 02/24/2016 11:04:02 AM Also confirmed by Manus Gunning  MD, STEPHEN [phone]), editor Whitney Post, Cala Bradford [phone])  on 02/24/2016 11:05:49 AM       Radiology Dg Chest 2 View  Result Date: 02/24/2016 CLINICAL DATA:  82 year old female with history of rib pain. EXAM: CHEST  2 VIEW COMPARISON:  Chest x-ray a 09/03/2015. FINDINGS: Lung volumes are low. No consolidative airspace disease. No pleural effusions. No pneumothorax. No pulmonary nodule or mass noted. Pulmonary vasculature and the cardiomediastinal silhouette are within normal limits. Aortic atherosclerosis. Visualized bony structures appear grossly intact. IMPRESSION: 1. Low lung volumes without  radiographic evidence of acute cardiopulmonary disease. 2. Aortic atherosclerosis. Electronically Signed   By: Trudie Reedaniel  Entrikin M.D.   On: 02/24/2016 12:27   Ct Head Wo Contrast  Result Date: 02/24/2016 CLINICAL DATA:  82 year old female found down this morning. Initial encounter. EXAM: CT HEAD WITHOUT CONTRAST CT CERVICAL SPINE WITHOUT CONTRAST TECHNIQUE: Multidetector CT imaging of the head and cervical spine was performed following the standard protocol without intravenous contrast. Multiplanar CT image reconstructions of the cervical spine were also generated. COMPARISON:  Head CT without contrast 11/14/2015 and earlier. FINDINGS: CT HEAD FINDINGS Brain: Stable cerebral volume. No ventriculomegaly. No midline shift, mass effect, or evidence of intracranial mass lesion. Stable gray-white matter differentiation throughout the brain. No cortically based acute infarct identified. Stable mild to moderate for age white matter hypodensity. No acute intracranial hemorrhage identified. Vascular: Calcified atherosclerosis at the skull base. Skull: Calvarium appears stable and intact. Sinuses/Orbits: Visualized paranasal sinuses and mastoids are stable and well pneumatized. Other:  Stable orbits soft tissues.  No scalp hematoma. CT CERVICAL SPINE FINDINGS Alignment: Relatively preserved cervical lordosis. Skull base and vertebrae: Visualized skull base is intact. No atlanto-occipital dissociation. Right TMJ degeneration incidentally noted. Bilateral posterior element alignment is within normal limits. Cervicothoracic junction alignment is within normal limits. No acute cervical spine fracture identified. Age indeterminate mild anterior superior endplate deformities in the upper thoracic spine at T1 and T2. Otherwise grossly intact visualized upper thoracic levels. Soft tissues and spinal canal: Calcified carotid atherosclerosis. Otherwise negative noncontrast neck soft tissues. Disc levels: Mild degenerative cervical spinal stenosis at C4-C5 and C5-C6. Disc and endplate degeneration at those levels. Multilevel left side cervical facet degeneration. Upper chest: Negative lung apices. Calcified aortic atherosclerosis. IMPRESSION: 1. No acute traumatic injury identified to the brain or head. Stable non contrast CT appearance of the brain. 2. No acute fracture or listhesis identified in the cervical spine. Ligamentous injury is not excluded. 3. Age indeterminate mild superior endplate compression fractures at T1 and T2. If there is upper thoracic level pain thoracic spine MRI or Nuclear Medicine Whole-body Bone Scan would best evaluate further. Electronically Signed   By: Odessa FlemingH  Hall M.D.   On: 02/24/2016 13:00   Ct Cervical Spine Wo Contrast  Result Date: 02/24/2016 CLINICAL DATA:  82 year old female found down this morning. Initial encounter. EXAM: CT HEAD WITHOUT CONTRAST CT CERVICAL SPINE WITHOUT CONTRAST TECHNIQUE: Multidetector CT imaging of the head and cervical spine was performed following the standard protocol without intravenous contrast. Multiplanar CT image reconstructions of the cervical spine were also generated. COMPARISON:  Head CT without contrast 11/14/2015 and earlier. FINDINGS:  CT HEAD FINDINGS Brain: Stable cerebral volume. No ventriculomegaly. No midline shift, mass effect, or evidence of intracranial mass lesion. Stable gray-white matter differentiation throughout the brain. No cortically based acute infarct identified. Stable mild to moderate for age white matter hypodensity. No acute intracranial hemorrhage identified. Vascular: Calcified atherosclerosis at the skull base. Skull: Calvarium appears stable and intact. Sinuses/Orbits: Visualized paranasal sinuses and mastoids are stable and well pneumatized. Other: Stable orbits soft tissues.  No scalp hematoma. CT CERVICAL SPINE FINDINGS Alignment: Relatively preserved cervical lordosis. Skull base and vertebrae: Visualized skull base is intact. No atlanto-occipital dissociation. Right TMJ degeneration incidentally noted. Bilateral posterior element alignment is within normal limits. Cervicothoracic junction alignment is within normal limits. No acute cervical spine fracture identified. Age indeterminate mild anterior superior endplate deformities in the upper thoracic spine at T1 and T2. Otherwise grossly intact visualized upper thoracic levels. Soft tissues and spinal  canal: Calcified carotid atherosclerosis. Otherwise negative noncontrast neck soft tissues. Disc levels: Mild degenerative cervical spinal stenosis at C4-C5 and C5-C6. Disc and endplate degeneration at those levels. Multilevel left side cervical facet degeneration. Upper chest: Negative lung apices. Calcified aortic atherosclerosis. IMPRESSION: 1. No acute traumatic injury identified to the brain or head. Stable non contrast CT appearance of the brain. 2. No acute fracture or listhesis identified in the cervical spine. Ligamentous injury is not excluded. 3. Age indeterminate mild superior endplate compression fractures at T1 and T2. If there is upper thoracic level pain thoracic spine MRI or Nuclear Medicine Whole-body Bone Scan would best evaluate further.  Electronically Signed   By: Odessa Fleming M.D.   On: 02/24/2016 13:00    Procedures Procedures (including critical care time)  Medications Ordered in ED Medications - No data to display   Initial Impression / Assessment and Plan / ED Course  I have reviewed the triage vital signs and the nursing notes.  Pertinent labs & imaging results that were available during my care of the patient were reviewed by me and considered in my medical decision making (see chart for details).  Clinical Course    Final Clinical Impressions(s) / ED Diagnoses  I have reviewed and evaluated the relevant laboratory values I have reviewed and evaluated the relevant imaging studies.  I have interpreted the relevant EKG. I have reviewed the relevant previous healthcare records. I have reviewed EMS Documentation. I obtained HPI from historian. Patient discussed with supervising physician  ED Course:  Assessment: Brittney Yang is a 88yF with hx dementia who presents with fall this AM. Hx falls. Unsure if syncopal or mechanical. Likely mechanical based on previous hx. Unsure of head trauma. On exam, Brittney Yang in NAD. Nontoxic/nonseptic appearing. VSS. Afebrile. Lungs CTA. Heart RRR. Abdomen nontender soft. CN evaluated and unremarkable. Labs baseline and unremarkable. UA unremarkable. CT Head unremarkable for acute abnormalities. CT C Spine unremarkable for acute abnormalities. CXR unremarkable. EKG unremarkable. Brittney Yang seen and evaluated by supervising physician. Plan is to DC home with follow up to PCP. At time of discharge, Patient is in no acute distress. Vital Signs are stable. Patient is able to ambulate. Patient able to tolerate PO.    Disposition/Plan:  DC Home Additional Verbal discharge instructions given and discussed with patient.  Brittney Yang Instructed to f/u with PCP in the next week for evaluation and treatment of symptoms. Return precautions given Brittney Yang acknowledges and agrees with plan  Supervising Physician Glynn Octave,  MD   Final diagnoses:  Fall, initial encounter    New Prescriptions New Prescriptions   No medications on file     Audry Pili, PA-C 02/24/16 1650    Glynn Octave, MD 02/24/16 2157

## 2016-02-24 NOTE — ED Triage Notes (Signed)
Pt fell this AM and was found down by her family. She is not on blood thinners and did not hit her head. She is not complaining of pain anywhere and vitals are stable. Family states that she has had falls recently.

## 2016-04-26 ENCOUNTER — Other Ambulatory Visit: Payer: Self-pay

## 2016-04-26 ENCOUNTER — Other Ambulatory Visit: Payer: Self-pay | Admitting: Endocrinology

## 2016-04-26 MED ORDER — LEVOTHYROXINE SODIUM 75 MCG PO TABS: 75.0000 ug | ORAL_TABLET | Freq: Every day | ORAL | 3 refills | Status: DC

## 2016-06-19 ENCOUNTER — Ambulatory Visit (INDEPENDENT_AMBULATORY_CARE_PROVIDER_SITE_OTHER): Payer: Medicare Other | Admitting: Endocrinology

## 2016-06-19 VITALS — BP 143/83 | HR 85 | Ht 59.06 in | Wt 129.6 lb

## 2016-06-19 DIAGNOSIS — E890 Postprocedural hypothyroidism: Secondary | ICD-10-CM

## 2016-06-19 DIAGNOSIS — E559 Vitamin D deficiency, unspecified: Secondary | ICD-10-CM | POA: Diagnosis not present

## 2016-06-19 LAB — TSH: TSH: 3.33 u[IU]/mL (ref 0.35–4.50)

## 2016-06-19 LAB — T4, FREE: Free T4: 0.69 ng/dL (ref 0.60–1.60)

## 2016-06-19 NOTE — Progress Notes (Signed)
Patient ID: Brittney Yang, female   DOB: [birth year], 83 y.o.   MRN: [id number]   Reason for Appointment:  Hypothyroidism, followup visit    History of Present Illness:   The hypothyroidism was first diagnosed in 08/2011 She had required 2 doses of radioactive iodine for her hyperthyroidism before she became hypothyroid Did not have any significant symptoms at the onset of hypothyroidism  Although initially her hypothyroidism was mild her doses of levothyroxine and have been gradually increased  Her last dosage change was in 2/17 when her dose was increased to 75 g; then her TSH was 9.6  Her daughter thinks that she is doing overall fairly well except has difficulty walking and feeling weaker on and off Her weight is about the same She is on multiple psychotropic drugs for her dementia and agitation    Wt Readings from Last 3 Encounters:  06/19/16 129 lb 9.6 oz (58.8 kg)  02/13/16 130 lb (59 kg)  09/03/15 132 lb (59.9 kg)     Lab Results  Component Value Date   TSH 5.66 (H) 02/14/2016   TSH 5.242 (H) 09/03/2015   TSH 9.65 (H) 08/15/2015   FREET4 0.57 (L) 02/14/2016   FREET4 0.55 (L) 08/15/2015   FREET4 0.64 02/11/2015      Allergies as of 06/19/2016      Reactions   Methimazole [methimazole] Anaphylaxis   Haloperidol Palpitations   Penicillins Hives   Has patient had a PCN reaction causing immediate rash, facial/tongue/throat swelling, SOB or lightheadedness with hypotension: Yes Has patient had a PCN reaction causing severe rash involving mucus membranes or skin necrosis: No Has patient had a PCN reaction that required hospitalization No Has patient had a PCN reaction occurring within the last 10 years: No If all of the above answers are "NO", then may proceed with Cephalosporin use.      Medication List       Accurate as of 06/19/16  9:54 AM. Always use your most recent med list.          acetaminophen 500 MG tablet Commonly known as:   TYLENOL Take 500-1,000 mg by mouth daily as needed for mild pain.   aspirin 325 MG tablet Take 325 mg by mouth at bedtime.   B-12 2500 MCG Tabs Take 2,500 mcg by mouth daily.   chlordiazePOXIDE 5 MG capsule Commonly known as:  LIBRIUM Take 5 mg by mouth at bedtime.   cholecalciferol 1000 units tablet Commonly known as:  VITAMIN D Take 5,000 Units by mouth daily.   donepezil 10 MG tablet Commonly known as:  ARICEPT Take 10 mg by mouth at bedtime.   levothyroxine 75 MCG tablet Commonly known as:  SYNTHROID, LEVOTHROID TAKE ONE TABLET BY MOUTH EVERY DAY   levothyroxine 75 MCG tablet Commonly known as:  SYNTHROID, LEVOTHROID Take 1 tablet (75 mcg total) by mouth daily.   QUEtiapine 100 MG tablet Commonly known as:  SEROQUEL Take 100 mg by mouth QID.       Past Medical History:  Diagnosis Date  . Dementia   . Thyroid disease   . Tremor     Past Surgical History:  Procedure Laterality Date  . THYROID SURGERY  1960    Family History  Problem Relation Age of Onset  . Hypertension Mother   . Hypertension Father     Social History:  reports that she has never smoked. She has never used smokeless tobacco. She reports that she does not drink alcohol or  use drugs.  Allergies:  Allergies  Allergen Reactions  . Methimazole [Methimazole] Anaphylaxis  . Haloperidol Palpitations  . Penicillins Hives    Has patient had a PCN reaction causing immediate rash, facial/tongue/throat swelling, SOB or lightheadedness with hypotension: Yes Has patient had a PCN reaction causing severe rash involving mucus membranes or skin necrosis: No Has patient had a PCN reaction that required hospitalization No Has patient had a PCN reaction occurring within the last 10 years: No If all of the above answers are "NO", then may proceed with Cephalosporin use.    REVIEW of systems:   She has ? Parkinson's disease She has had dementia and is on Aricept and Seroquel  She has had a  history of osteoporosis previously treated with Fosamax.    VITAMIN D deficiency: her vitamin D level was only about 13 at baseline and she is on supplements, Currently taking 1000 units  Lab Results  Component Value Date   VD25OH 36.07 02/14/2016   VD25OH 30.87 08/15/2015   VD25OH 59.85 02/11/2015       Examination:   BP (!) 143/83   Pulse 85   Ht 4' 11.06" (1.5 m)   Wt 129 lb 9.6 oz (58.8 kg)   BMI 26.13 kg/m   She is looking well and conversing fairly well  No puffiness of the face Heart sounds normal, heart rate about 80 regular Deep tendon reflexes 2+ bilaterally at biceps. No fine tremor    Assessments   Hypothyroidism, post ablative since 2013 Although her TSH has been mildly increased it has been relatively stable This is likely appropriate for her age  Clinically doing well except for her dementia Also may have Parkinson's, Recommended that she discussed neurology consultation with PCP because of present difficulties with possible gait imbalance intermittently  Will need to reassess her TSH today and adjust dosage accordingly  Vitamin D deficiency: Needs follow-up on the next visit, meanwhile continue 1000 units daily    Lemond Griffee 06/19/2016, 9:54 AM

## 2016-06-19 NOTE — Progress Notes (Signed)
Please let patient know that the lab result is normal and no further action needed

## 2016-09-24 ENCOUNTER — Other Ambulatory Visit: Payer: Self-pay | Admitting: Endocrinology

## 2016-12-05 ENCOUNTER — Inpatient Hospital Stay (HOSPITAL_COMMUNITY)
Admission: EM | Admit: 2016-12-05 | Discharge: 2016-12-09 | DRG: 682 | Disposition: A | Discharge status: Home / Self Care | Payer: Medicare Other | Attending: Internal Medicine | Admitting: Internal Medicine

## 2016-12-05 ENCOUNTER — Encounter (HOSPITAL_COMMUNITY): Payer: Self-pay

## 2016-12-05 ENCOUNTER — Emergency Department (HOSPITAL_COMMUNITY): Payer: Medicare Other

## 2016-12-05 ENCOUNTER — Observation Stay (HOSPITAL_COMMUNITY): Payer: Medicare Other

## 2016-12-05 DIAGNOSIS — W19XXXA Unspecified fall, initial encounter: Secondary | ICD-10-CM | POA: Diagnosis not present

## 2016-12-05 DIAGNOSIS — F0150 Vascular dementia without behavioral disturbance: Secondary | ICD-10-CM | POA: Diagnosis present

## 2016-12-05 DIAGNOSIS — E039 Hypothyroidism, unspecified: Secondary | ICD-10-CM | POA: Diagnosis present

## 2016-12-05 DIAGNOSIS — R4781 Slurred speech: Secondary | ICD-10-CM

## 2016-12-05 DIAGNOSIS — Z7982 Long term (current) use of aspirin: Secondary | ICD-10-CM

## 2016-12-05 DIAGNOSIS — I10 Essential (primary) hypertension: Secondary | ICD-10-CM

## 2016-12-05 DIAGNOSIS — F0390 Unspecified dementia without behavioral disturbance: Secondary | ICD-10-CM | POA: Diagnosis present

## 2016-12-05 DIAGNOSIS — N179 Acute kidney failure, unspecified: Secondary | ICD-10-CM | POA: Diagnosis not present

## 2016-12-05 DIAGNOSIS — E872 Acidosis, unspecified: Secondary | ICD-10-CM | POA: Diagnosis present

## 2016-12-05 DIAGNOSIS — Z880 Allergy status to penicillin: Secondary | ICD-10-CM

## 2016-12-05 DIAGNOSIS — R531 Weakness: Secondary | ICD-10-CM | POA: Diagnosis not present

## 2016-12-05 DIAGNOSIS — E860 Dehydration: Secondary | ICD-10-CM | POA: Diagnosis present

## 2016-12-05 DIAGNOSIS — G9340 Encephalopathy, unspecified: Secondary | ICD-10-CM

## 2016-12-05 DIAGNOSIS — G9341 Metabolic encephalopathy: Secondary | ICD-10-CM | POA: Diagnosis present

## 2016-12-05 DIAGNOSIS — Z79899 Other long term (current) drug therapy: Secondary | ICD-10-CM

## 2016-12-05 DIAGNOSIS — Z888 Allergy status to other drugs, medicaments and biological substances status: Secondary | ICD-10-CM

## 2016-12-05 DIAGNOSIS — W1830XA Fall on same level, unspecified, initial encounter: Secondary | ICD-10-CM | POA: Diagnosis present

## 2016-12-05 DIAGNOSIS — Z79890 Hormone replacement therapy (postmenopausal): Secondary | ICD-10-CM

## 2016-12-05 DIAGNOSIS — E876 Hypokalemia: Secondary | ICD-10-CM | POA: Diagnosis present

## 2016-12-05 DIAGNOSIS — R251 Tremor, unspecified: Secondary | ICD-10-CM

## 2016-12-05 LAB — URINALYSIS, ROUTINE W REFLEX MICROSCOPIC
BILIRUBIN URINE: NEGATIVE
Glucose, UA: NEGATIVE mg/dL
Ketones, ur: NEGATIVE mg/dL
NITRITE: NEGATIVE
PH: 7 (ref 5.0–8.0)
Protein, ur: NEGATIVE mg/dL
SPECIFIC GRAVITY, URINE: 1.005 (ref 1.005–1.030)
Squamous Epithelial / LPF: NONE SEEN

## 2016-12-05 LAB — COMPREHENSIVE METABOLIC PANEL
ALT: 13 U/L — ABNORMAL LOW (ref 14–54)
ANION GAP: 11 (ref 5–15)
AST: 33 U/L (ref 15–41)
Albumin: 3.7 g/dL (ref 3.5–5.0)
Alkaline Phosphatase: 55 U/L (ref 38–126)
BILIRUBIN TOTAL: 0.5 mg/dL (ref 0.3–1.2)
BUN: 15 mg/dL (ref 6–20)
CHLORIDE: 105 mmol/L (ref 101–111)
CO2: 22 mmol/L (ref 22–32)
Calcium: 8.9 mg/dL (ref 8.9–10.3)
Creatinine, Ser: 1.26 mg/dL — ABNORMAL HIGH (ref 0.44–1.00)
GFR, EST AFRICAN AMERICAN: 43 mL/min — AB (ref >60)
GFR, EST NON AFRICAN AMERICAN: 37 mL/min — AB (ref >60)
Glucose, Bld: 118 mg/dL — ABNORMAL HIGH (ref 65–99)
POTASSIUM: 3.8 mmol/L (ref 3.5–5.1)
Sodium: 138 mmol/L (ref 135–145)
TOTAL PROTEIN: 7.1 g/dL (ref 6.5–8.1)

## 2016-12-05 LAB — I-STAT CHEM 8, ED
BUN: 20 mg/dL (ref 6–20)
CREATININE: 1.1 mg/dL — AB (ref 0.44–1.00)
Calcium, Ion: 1.02 mmol/L — ABNORMAL LOW (ref 1.15–1.40)
Chloride: 104 mmol/L (ref 101–111)
Glucose, Bld: 120 mg/dL — ABNORMAL HIGH (ref 65–99)
HEMATOCRIT: 41 % (ref 36.0–46.0)
Hemoglobin: 13.9 g/dL (ref 12.0–15.0)
POTASSIUM: 4 mmol/L (ref 3.5–5.1)
Sodium: 140 mmol/L (ref 135–145)
TCO2: 24 mmol/L (ref 0–100)

## 2016-12-05 LAB — DIFFERENTIAL
BASOS PCT: 0 %
Basophils Absolute: 0 10*3/uL (ref 0.0–0.1)
Eosinophils Absolute: 0 10*3/uL (ref 0.0–0.7)
Eosinophils Relative: 0 %
LYMPHS PCT: 21 %
Lymphs Abs: 1.4 10*3/uL (ref 0.7–4.0)
MONO ABS: 0.5 10*3/uL (ref 0.1–1.0)
MONOS PCT: 8 %
NEUTROS ABS: 4.8 10*3/uL (ref 1.7–7.7)
Neutrophils Relative %: 71 %

## 2016-12-05 LAB — PROTIME-INR
INR: 0.99
Prothrombin Time: 13 seconds (ref 11.4–15.2)

## 2016-12-05 LAB — APTT: APTT: 23 s — AB (ref 24–36)

## 2016-12-05 LAB — I-STAT CG4 LACTIC ACID, ED
LACTIC ACID, VENOUS: 4.02 mmol/L — AB (ref 0.5–1.9)
Lactic Acid, Venous: 2.21 mmol/L (ref 0.5–1.9)

## 2016-12-05 LAB — CBC
HEMATOCRIT: 39 % (ref 36.0–46.0)
HEMOGLOBIN: 13.3 g/dL (ref 12.0–15.0)
MCH: 31.1 pg (ref 26.0–34.0)
MCHC: 34.1 g/dL (ref 30.0–36.0)
MCV: 91.3 fL (ref 78.0–100.0)
Platelets: 238 10*3/uL (ref 150–400)
RBC: 4.27 MIL/uL (ref 3.87–5.11)
RDW: 13.7 % (ref 11.5–15.5)
WBC: 6.8 10*3/uL (ref 4.0–10.5)

## 2016-12-05 LAB — LACTIC ACID, PLASMA: Lactic Acid, Venous: 1.9 mmol/L (ref 0.5–1.9)

## 2016-12-05 LAB — CBG MONITORING, ED: Glucose-Capillary: 95 mg/dL (ref 65–99)

## 2016-12-05 LAB — I-STAT TROPONIN, ED: Troponin i, poc: 0.01 ng/mL (ref 0.00–0.08)

## 2016-12-05 MED ORDER — ASPIRIN 325 MG PO TABS
325.0000 mg | ORAL_TABLET | Freq: Every day | ORAL | Status: DC
  Administered 2016-12-05 – 2016-12-07 (×3): 325 mg via ORAL
  Filled 2016-12-05 (×4): qty 1

## 2016-12-05 MED ORDER — SODIUM CHLORIDE 0.9 % IV BOLUS (SEPSIS)
500.0000 mL | Freq: Once | INTRAVENOUS | Status: AC
  Administered 2016-12-05: 500 mL via INTRAVENOUS

## 2016-12-05 MED ORDER — LORAZEPAM 2 MG/ML IJ SOLN
2.0000 mg | Freq: Once | INTRAMUSCULAR | Status: AC
  Administered 2016-12-05: 2 mg via INTRAVENOUS
  Filled 2016-12-05: qty 1

## 2016-12-05 MED ORDER — SODIUM CHLORIDE 0.9 % IV SOLN
INTRAVENOUS | Status: DC
  Administered 2016-12-05: 13:00:00 via INTRAVENOUS

## 2016-12-05 MED ORDER — ACETAMINOPHEN 500 MG PO TABS
500.0000 mg | ORAL_TABLET | Freq: Every day | ORAL | Status: DC | PRN
  Administered 2016-12-06: 500 mg via ORAL
  Filled 2016-12-05: qty 1

## 2016-12-05 MED ORDER — LEVOTHYROXINE SODIUM 75 MCG PO TABS
75.0000 ug | ORAL_TABLET | Freq: Every day | ORAL | Status: DC
  Administered 2016-12-07 – 2016-12-09 (×3): 75 ug via ORAL
  Filled 2016-12-05 (×4): qty 1

## 2016-12-05 MED ORDER — LORAZEPAM 0.5 MG PO TABS
0.5000 mg | ORAL_TABLET | Freq: Three times a day (TID) | ORAL | Status: DC | PRN
  Administered 2016-12-06: 0.5 mg via ORAL
  Filled 2016-12-05 (×2): qty 1

## 2016-12-05 MED ORDER — SODIUM CHLORIDE 0.9 % IV SOLN: INTRAVENOUS | Status: AC

## 2016-12-05 MED ORDER — DONEPEZIL HCL 10 MG PO TABS
10.0000 mg | ORAL_TABLET | Freq: Every day | ORAL | Status: DC
  Administered 2016-12-05 – 2016-12-07 (×3): 10 mg via ORAL
  Filled 2016-12-05 (×4): qty 1

## 2016-12-05 MED ORDER — ENOXAPARIN SODIUM 40 MG/0.4ML ~~LOC~~ SOLN
40.0000 mg | SUBCUTANEOUS | Status: DC
  Administered 2016-12-07: 40 mg via SUBCUTANEOUS
  Filled 2016-12-05 (×2): qty 0.4

## 2016-12-05 MED ORDER — RISPERIDONE 0.5 MG PO TABS
0.5000 mg | ORAL_TABLET | Freq: Two times a day (BID) | ORAL | Status: DC
  Administered 2016-12-05 – 2016-12-08 (×5): 0.5 mg via ORAL
  Filled 2016-12-05 (×6): qty 1

## 2016-12-05 MED ORDER — CEPHALEXIN 500 MG PO CAPS
500.0000 mg | ORAL_CAPSULE | Freq: Two times a day (BID) | ORAL | Status: DC
  Administered 2016-12-05 – 2016-12-09 (×6): 500 mg via ORAL
  Filled 2016-12-05 (×8): qty 1

## 2016-12-05 MED ORDER — SODIUM CHLORIDE 0.9 % IV BOLUS (SEPSIS)
250.0000 mL | Freq: Once | INTRAVENOUS | Status: AC
Start: 2016-12-05 — End: 2016-12-05
  Administered 2016-12-05: 250 mL via INTRAVENOUS

## 2016-12-05 MED ORDER — SODIUM CHLORIDE 0.9 % IV BOLUS (SEPSIS)
1000.0000 mL | Freq: Once | INTRAVENOUS | Status: AC
  Administered 2016-12-05: 1000 mL via INTRAVENOUS

## 2016-12-05 NOTE — H&P (Signed)
History and Physical    [place] P Yang MVH:[id number] DOB: 1927/08/19 DOA: 12/05/2016  PCP: Barbie BannerWilson, Fred H, MD  Patient coming from: Home.   I have personally briefly reviewed patient[facility]  Chief Complaint: fall on the floor.   HPI: Brittney Yang is a 83 y.o. female with medical history significant of hypothyroidism, vascular dementia was brought in by family for a fall. As per the daughter at bedside, she went out to get paper and when she was back in to the house, the patient was on the floor on the left side and couldn't get up. She was brought to ED for further eval.  Pt is a poor historian, not much history available. She denies any pain. She is confused and the family thinks she is more confused than baseline and they also reported some slurring of speech after they picked her from the floor.  There is no fever  Or chills, cough, vomiting or diarrhea.  She denies any abd pain pain. She has an infection of the left great toe and has been taking keflex for the last 6 days.  On arrival to ED, she was found to be dehydrated. Labs revealed elevated lactic acid. UA did not show any infection. CT head does not show any acute abnormality.  CXR does not reveal any pneumonia.   She was referred to medical service for observation for the fall, deconditioning, .   Review of Systems: couldn't be obtained in detail as she has dementia and is confused.   Past Medical History:  Diagnosis Date  . Dementia   . Thyroid disease   . Tremor     Past Surgical History:  Procedure Laterality Date  . THYROID SURGERY  1960     reports that she has never smoked. She has never used smokeless tobacco. She reports that she does not drink alcohol or use drugs.  Allergies  Allergen Reactions  . Methimazole [Methimazole] Anaphylaxis  . Haloperidol Palpitations  . Penicillins Hives    Has patient had a PCN reaction causing immediate rash, facial/tongue/throat  swelling, SOB or lightheadedness with hypotension: Yes Has patient had a PCN reaction causing severe rash involving mucus membranes or skin necrosis: No Has patient had a PCN reaction that required hospitalization No Has patient had a PCN reaction occurring within the last 10 years: No If all of the above answers are "NO", then may proceed with Cephalosporin use.     Family History  Problem Relation Age of Onset  . Hypertension Mother   . Hypertension Father    couldn't be obtained as she has dementia.   Prior to Admission medications   Medication Sig Start Date End Date Taking? Authorizing Provider  aspirin 325 MG tablet Take 325 mg by mouth at bedtime.    Yes [provider]  cephALEXin (KEFLEX) 500 MG capsule Take 500 mg by mouth 3 (three) times daily.   Yes [provider]  cholecalciferol (VITAMIN D) 1000 units tablet Take 5,000 Units by mouth daily.   Yes [provider]  Cyanocobalamin (B-12) 2500 MCG TABS Take 2,500 mcg by mouth daily.   Yes [provider]  donepezil (ARICEPT) 10 MG tablet Take 10 mg by mouth at bedtime.    Yes [provider]  levothyroxine (SYNTHROID, LEVOTHROID) 75 MCG tablet Take 1 tablet (75 mcg total) by mouth daily. 04/26/16  Yes Reather LittlerKumar, Ajay, MD  LORazepam (ATIVAN) 0.5 MG tablet Take 0.5 mg by mouth  3 (three) times daily as needed for anxiety.   Yes [provider]  risperiDONE (RISPERDAL) 0.5 MG tablet Take 0.5 mg by mouth 2 (two) times daily.   Yes [provider]  acetaminophen (TYLENOL) 500 MG tablet Take 500-1,000 mg by mouth daily as needed for mild pain.    [provider]  levothyroxine (SYNTHROID, LEVOTHROID) 75 MCG tablet TAKE 1 TABLET (75 MCG TOTAL) BY MOUTH DAILY. Patient not taking: Reported on 12/05/2016 09/24/16   Reather Littler, MD    Physical Exam: Vitals:   12/05/16 1515 12/05/16 1545 12/05/16 1600 12/05/16 1645  BP: (!) 160/89 (!) 149/80 (!) 162/88 (!) 155/92  Pulse:  85 83 87 88  Resp: 14 15 14 18   Temp:      TempSrc:      SpO2: 100% 99% 99% 96%    Constitutional: NAD, calm, comfortable Vitals:   12/05/16 1515 12/05/16 1545 12/05/16 1600 12/05/16 1645  BP: (!) 160/89 (!) 149/80 (!) 162/88 (!) 155/92  Pulse: 85 83 87 88  Resp: 14 15 14 18   Temp:      TempSrc:      SpO2: 100% 99% 99% 96%   Eyes: PERRL, lids and conjunctivae normal ENMT: Mucous membranes are moist. Posterior pharynx clear of any exudate or lesions.Normal dentition.  Neck: normal, supple, no masses, no thyromegaly Respiratory: clear to auscultation bilaterally, no wheezing, no crackles. Normal respiratory effort. No accessory muscle use.  Cardiovascular: Regular rate and rhythm, no murmurs / rubs / gallops. No extremity edema. 2+ pedal pulses. No carotid bruits.  Abdomen: no tenderness, no masses palpated. No hepatosplenomegaly. Bowel sounds positive.  Musculoskeletal: no clubbing / cyanosis. No joint deformity upper and lower extremities. Good ROM, no contractures. Normal muscle tone.  Skin: no rashes, lesions, ulcers. No induration Neurologic: CN 2-12 grossly intact. Sensation intact,able to move alle xtremities.   Labs on Admission: I have personally reviewed following labs and imaging studies  CBC:  Recent Labs Lab 12/05/16 1124 12/05/16 1151  WBC 6.8  --   NEUTROABS 4.8  --   HGB 13.3 13.9  HCT 39.0 41.0  MCV 91.3  --   PLT 238  --    Basic Metabolic Panel:  Recent Labs Lab 12/05/16 1124 12/05/16 1151  NA 138 140  K 3.8 4.0  CL 105 104  CO2 22  --   GLUCOSE 118* 120*  BUN 15 20  CREATININE 1.26* 1.10*  CALCIUM 8.9  --    GFR: CrCl cannot be calculated (Unknown ideal weight.). Liver Function Tests:  Recent Labs Lab 12/05/16 1124  AST 33  ALT 13*  ALKPHOS 55  BILITOT 0.5  PROT 7.1  ALBUMIN 3.7   No results for input(s): LIPASE, AMYLASE in the last 168 hours. No results for input(s): AMMONIA in the last 168 hours. Coagulation Profile: No  results for input(s): INR, PROTIME in the last 168 hours. Cardiac Enzymes: No results for input(s): CKTOTAL, CKMB, CKMBINDEX, TROPONINI in the last 168 hours. BNP (last 3 results) No results for input(s): PROBNP in the last 8760 hours. HbA1C: No results for input(s): HGBA1C in the last 72 hours. CBG:  Recent Labs Lab 12/05/16 1220  GLUCAP 95   Lipid Profile: No results for input(s): CHOL, HDL, LDLCALC, TRIG, CHOLHDL, LDLDIRECT in the last 72 hours. Thyroid Function Tests: No results for input(s): TSH, T4TOTAL, FREET4, T3FREE, THYROIDAB in the last 72 hours. Anemia Panel: No results for input(s): VITAMINB12, FOLATE, FERRITIN, TIBC, IRON, RETICCTPCT in the last 72  hours. Urine analysis:    Component Value Date/Time   COLORURINE STRAW (A) 12/05/2016 1523   APPEARANCEUR CLEAR 12/05/2016 1523   LABSPEC 1.005 12/05/2016 1523   PHURINE 7.0 12/05/2016 1523   GLUCOSEU NEGATIVE 12/05/2016 1523   HGBUR SMALL (A) 12/05/2016 1523   BILIRUBINUR NEGATIVE 12/05/2016 1523   KETONESUR NEGATIVE 12/05/2016 1523   PROTEINUR NEGATIVE 12/05/2016 1523   UROBILINOGEN 0.2 09/22/2014 1445   NITRITE NEGATIVE 12/05/2016 1523   LEUKOCYTESUR TRACE (A) 12/05/2016 1523    Radiological Exams on Admission: Ct Head Wo Contrast  Result Date: 12/05/2016 CLINICAL DATA:  Altered mental status. Found on floor. History of dementia. EXAM: CT HEAD WITHOUT CONTRAST TECHNIQUE: Contiguous axial images were obtained from the base of the skull through the vertex without intravenous contrast. COMPARISON:  02/24/2016 FINDINGS: Brain: There is no evidence of acute infarct, intracranial hemorrhage, mass, midline shift, or extra-axial fluid collection. Mild cerebral atrophy is unchanged. Deep cerebral white matter hypodensities are similar to the prior study and nonspecific but compatible with mild chronic small vessel ischemic disease. Vascular: Calcified atherosclerosis at the skullbase. No hyperdense vessel. Skull: No fracture  or focal osseous lesion. Sinuses/Orbits: Visualized paranasal sinuses are clear. There is mild chronic air cell opacification at the left mastoid tip with osseous sclerosis. No acute orbital abnormality. Other: None. IMPRESSION: 1. No evidence of acute intracranial abnormality. 2. Mild chronic small vessel ischemic disease. Electronically Signed   By: Sebastian Ache M.D.   On: 12/05/2016 13:27   Dg Chest Port 1 View  Result Date: 12/05/2016 CLINICAL DATA:  Fall, left-sided weakness, initial encounter. EXAM: PORTABLE CHEST 1 VIEW COMPARISON:  02/24/2016. FINDINGS: Trachea is midline. Heart size stable. Thoracic aorta is calcified. Lungs are somewhat low in volume but clear. No pneumothorax. IMPRESSION: No acute findings. Electronically Signed   By: Leanna Battles M.D.   On: 12/05/2016 12:41    EKG: Independently reviewed. NSR.   Assessment/Plan Active Problems:   Fall Fall  Unclear etiology. Suspect that she was trying to get up as she had a BM as per the daughter at bedside.  Deconditioning. Vs dehydration.  Monitor over tele overnight. No syncope witnessed.  CT head neg, because she had some slurred speech get MRI brain.  PT eval in am.    Dementia: resume home meds.    Hypothyroidism: Resume synthroid.   Elevated lactic acid.Possibly from dehydration. Gently hydrate and repeat lactic acid tonight.    Acute renal failure:  Possibly pre renal azotemia:  Hydrate and repeat levels improved.  UA does not show infection.    Great toe inf on the left: Resume keflex to complete the course.     DVT prophylaxis:lovenox.  Code Status: full code.  Family Communication: family at bedside.  Disposition Plan: 1 to 2 days, depending  on PT eval Consults called:none.  Admission status: obs telem   Viana Sleep MD [facility] Pager [phone]  If 7PM-7AM, please contact night-coverage [email or web address] Password [facility]  12/05/2016, 5:47 PM

## 2016-12-05 NOTE — ED Notes (Signed)
Phlebotomy called regarding pt labs, will attempt to draw labs from pt.

## 2016-12-05 NOTE — ED Notes (Signed)
When attempting to ambulate pt, pt exhibited generalized weakness and difficulty standing. Pt able to stand with +2 assist. When attempting to take small steps, pt very weak and her knees buckled. Pt assisted back on the stretcher. Dr. Effie ShyWentz away.

## 2016-12-05 NOTE — ED Notes (Signed)
Pt transported to MRI 

## 2016-12-05 NOTE — ED Notes (Signed)
Lactic acid result given to Dr. Wentz 

## 2016-12-05 NOTE — ED Notes (Addendum)
Explained to Dr. Effie ShyWentz pt had failed previous stroke swallow screen and this nurse was unable to provide pt with food or water. Per Dr. Effie ShyWentz, pt to have food and water brought to her regardless of stroke swallow screen due to pt not being here for stroke sx. Pt brought applesauce.

## 2016-12-05 NOTE — ED Provider Notes (Signed)
[facility]-EMERGENCY DEPT Provider Note   CSN: [id number] Arrival date & time: 12/05/16  1115     History   Chief Complaint Chief Complaint  Patient presents with  . Fall  . Altered Mental Status    HPI Brittney Yang is a 83 y.o. female.  Patient found on floor by bedside, incontinent of stool, by her daughter.  She confused at baseline but somewhat more so at this time according to report.  She was started on risperidone, and lorazepam, after her visit with her doctor, 6 days ago.  At that time her Librium, and Seroquel were stopped.  This morning she was on the floor just a brief time, and her daughter was able to help her up and walk afterwards.  Her daughter does not believe that she has an injury.  Patient has become somewhat more sleepy, since the fall, but has been able to eat an ice cream sandwich since then.  She presents by EMS for evaluation.  Patient has been in general decline recently, with shaking spells, and has been evaluated for "Parkinson's," but her neurologist does not think she has Parkinson's disease.  There is been no recent fever, chills, vomiting, cough or reported chest pain.  Level 5 caveat-confusion  HPI  Past Medical History:  Diagnosis Date  . Dementia   . Thyroid disease   . Tremor     Patient Active Problem List   Diagnosis Date Noted  . Hypothyroidism, postradioiodine therapy 07/05/2014  . Hypotension 05/02/2014  . Hypothermia 05/02/2014  . 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 Dementia without behavioral disturbance 01/01/2014  . Postsurgical hypothyroidism 03/31/2013    Past Surgical History:  Procedure Laterality Date  . THYROID SURGERY  1960    OB History    No data available       Home Medications    Prior to Admission medications   Medication Sig Start Date End Date Taking? Authorizing Provider  aspirin 325 MG tablet Take 325 mg by mouth at bedtime.    Yes [provider]    cephALEXin (KEFLEX) 500 MG capsule Take 500 mg by mouth 3 (three) times daily.   Yes [provider]  cholecalciferol (VITAMIN D) 1000 units tablet Take 5,000 Units by mouth daily.   Yes [provider]  Cyanocobalamin (B-12) 2500 MCG TABS Take 2,500 mcg by mouth daily.   Yes [provider]  donepezil (ARICEPT) 10 MG tablet Take 10 mg by mouth at bedtime.    Yes [provider]  levothyroxine (SYNTHROID, LEVOTHROID) 75 MCG tablet Take 1 tablet (75 mcg total) by mouth daily. 04/26/16  Yes Reather Littler, MD  LORazepam (ATIVAN) 0.5 MG tablet Take 0.5 mg by mouth 3 (three) times daily as needed for anxiety.   Yes [provider]  risperiDONE (RISPERDAL) 0.5 MG tablet Take 0.5 mg by mouth 2 (two) times daily.   Yes [provider]  acetaminophen (TYLENOL) 500 MG tablet Take 500-1,000 mg by mouth daily as needed for mild pain.    [provider]  levothyroxine (SYNTHROID, LEVOTHROID) 75 MCG tablet TAKE 1 TABLET (75 MCG TOTAL) BY MOUTH DAILY. Patient not taking: Reported on 12/05/2016 09/24/16   Reather Littler, MD    Family History Family History  Problem Relation Age of Onset  . Hypertension Mother   . Hypertension Father     Social History Social History  Substance Use Topics  . Smoking status: Never Smoker  . Smokeless tobacco:  Never Used  . Alcohol use No     Allergies   Methimazole [methimazole]; Haloperidol; and Penicillins   Review of Systems Review of Systems  Unable to perform ROS: Mental status change     Physical Exam Updated Vital Signs BP (!) 162/88   Pulse 87   Temp 97.8 F (36.6 C)   Resp 14   SpO2 99%   Physical Exam  Constitutional: She appears well-developed. She appears distressed (She is uncomfortable).  Elderly, frail  HENT:  Head: Normocephalic and atraumatic.  Dry oral mucous membranes  Eyes: Conjunctivae and EOM are normal. Pupils are equal, round, and reactive to light.  Neck: Normal range  of motion and phonation normal. Neck supple.  Cardiovascular: Normal rate and regular rhythm.   Pulmonary/Chest: Effort normal and breath sounds normal. She exhibits no tenderness.  Abdominal: Soft. She exhibits no distension. There is no tenderness. There is no guarding.  Musculoskeletal: Normal range of motion. She exhibits no deformity.  Neurological: She is alert. She exhibits normal muscle tone.  She does not respond verbally to questions but does cooperate with exam.  She is somewhat tremulous, nonrhythmic.  Normal tone in arms and legs bilaterally.  Grip strength normal hands bilaterally.  She is able to independently elevate each leg off the stretcher for at least 5 seconds.  Skin: Skin is warm and dry.  Psychiatric:  She appears depressed  Nursing note and vitals reviewed.    ED Treatments / Results  Labs (all labs ordered are listed, but only abnormal results are displayed) Labs Reviewed  COMPREHENSIVE METABOLIC PANEL - Abnormal; Notable for the following:       Result Value   Glucose, Bld 118 (*)    Creatinine, Ser 1.26 (*)    ALT 13 (*)    GFR calc non Af Amer 37 (*)    GFR calc Af Amer 43 (*)    All other components within normal limits  URINALYSIS, ROUTINE W REFLEX MICROSCOPIC - Abnormal; Notable for the following:    Color, Urine STRAW (*)    Hgb urine dipstick SMALL (*)    Leukocytes, UA TRACE (*)    Bacteria, UA RARE (*)    All other components within normal limits  I-STAT CHEM 8, ED - Abnormal; Notable for the following:    Creatinine, Ser 1.10 (*)    Glucose, Bld 120 (*)    Calcium, Ion 1.02 (*)    All other components within normal limits  I-STAT CG4 LACTIC ACID, ED - Abnormal; Notable for the following:    Lactic Acid, Venous 4.02 (*)    All other components within normal limits  I-STAT CG4 LACTIC ACID, ED - Abnormal; Notable for the following:    Lactic Acid, Venous 2.21 (*)    All other components within normal limits  CULTURE, BLOOD (ROUTINE X 2)    CULTURE, BLOOD (ROUTINE X 2)  URINE CULTURE  CBC  DIFFERENTIAL  PROTIME-INR  APTT  CBG MONITORING, ED  I-STAT TROPOININ, ED  CBG MONITORING, ED    EKG  EKG Interpretation  Date/Time:  Wednesday December 05 2016 11:31:57 EDT Ventricular Rate:  93 PR Interval:  182 QRS Duration: 78 QT Interval:  386 QTC Calculation: 479 R Axis:   53 Text Interpretation:  Normal sinus rhythm Low voltage QRS Borderline ECG since last tracing no significant change Confirmed by Mancel Bale [phone]) on 12/05/2016 12:56:38 PM       Radiology Ct Head Wo Contrast  Result Date: 12/05/2016  CLINICAL DATA:  Altered mental status. Found on floor. History of dementia. EXAM: CT HEAD WITHOUT CONTRAST TECHNIQUE: Contiguous axial images were obtained from the base of the skull through the vertex without intravenous contrast. COMPARISON:  02/24/2016 FINDINGS: Brain: There is no evidence of acute infarct, intracranial hemorrhage, mass, midline shift, or extra-axial fluid collection. Mild cerebral atrophy is unchanged. Deep cerebral white matter hypodensities are similar to the prior study and nonspecific but compatible with mild chronic small vessel ischemic disease. Vascular: Calcified atherosclerosis at the skullbase. No hyperdense vessel. Skull: No fracture or focal osseous lesion. Sinuses/Orbits: Visualized paranasal sinuses are clear. There is mild chronic air cell opacification at the left mastoid tip with osseous sclerosis. No acute orbital abnormality. Other: None. IMPRESSION: 1. No evidence of acute intracranial abnormality. 2. Mild chronic small vessel ischemic disease. Electronically Signed   By: Sebastian Ache M.D.   On: 12/05/2016 13:27   Dg Chest Port 1 View  Result Date: 12/05/2016 CLINICAL DATA:  Fall, left-sided weakness, initial encounter. EXAM: PORTABLE CHEST 1 VIEW COMPARISON:  02/24/2016. FINDINGS: Trachea is midline. Heart size stable. Thoracic aorta is calcified. Lungs are somewhat low in volume but  clear. No pneumothorax. IMPRESSION: No acute findings. Electronically Signed   By: Leanna Battles M.D.   On: 12/05/2016 12:41    Procedures Procedures (including critical care time)  Medications Ordered in ED Medications  0.9 %  sodium chloride infusion ( Intravenous New Bag/Given 12/05/16 1254)  sodium chloride 0.9 % bolus 1,000 mL (1,000 mLs Intravenous New Bag/Given 12/05/16 1453)    And  sodium chloride 0.9 % bolus 500 mL (500 mLs Intravenous New Bag/Given 12/05/16 1609)    And  sodium chloride 0.9 % bolus 250 mL (not administered)  sodium chloride 0.9 % bolus 500 mL (0 mLs Intravenous Stopped 12/05/16 1335)     Initial Impression / Assessment and Plan / ED Course  I have reviewed the triage vital signs and the nursing notes.  Pertinent labs & imaging results that were available during my care of the patient were reviewed by me and considered in my medical decision making (see chart for details).      Patient Vitals for the past 24 hrs:  BP Temp Temp src Pulse Resp SpO2  12/05/16 1600 (!) 162/88 - - 87 14 99 %  12/05/16 1545 (!) 149/80 - - 83 15 99 %  12/05/16 1515 (!) 160/89 - - 85 14 100 %  12/05/16 1445 (!) 155/84 - - 87 14 99 %  12/05/16 1400 (!) 152/86 - - 87 (!) 25 100 %  12/05/16 1345 (!) 143/77 - - 78 15 100 %  12/05/16 1325 - 97.8 F (36.6 C) - - - -  12/05/16 1311 - 97.6 F (36.4 C) Rectal - - -  12/05/16 1300 (!) 125/59 - - 78 19 99 %  12/05/16 1239 122/77 - - 80 18 98 %  12/05/16 1230 122/77 - - 80 (!) 22 98 %  12/05/16 1215 106/61 - - 79 19 97 %  12/05/16 1128 (!) 107/52 97.2 F (36.2 C) Oral 94 17 100 %    4:40 PM Reevaluation with update and discussion. After initial assessment and treatment, an updated evaluation reveals patient is more alert, and has better color at this time.  Unfortunately and attempted ambulation failed because the patient could not stand without falling.  She continues to have a nonfocal neurologic examination.  There is no  indication for acute stroke, or TIA.  Patient  continues to have tremor.  Findings discussed with patient and her daughter, all questions answered.  They agree with admission for management. Sharyah Bostwick L   4:45 PM-Consult complete with hospitalist. Patient case explained and discussed.  She agrees to admit patient for further evaluation and treatment. Call ended at 17: 05  Final Clinical Impressions(s) / ED Diagnoses   Final diagnoses:  Fall, initial encounter  Weakness  Tremor  Hypertension, unspecified type    Patient with a fall but no serious injury.  She continues to be much weaker than usual and is unable to walk.  Initial lactate elevation, almost completely cleared with IV fluids.  Patient was maintained in the ED, without starting antibiotics.  She will require overnight hospitalization for observation with expectant management.  Nursing Notes Reviewed/ Care Coordinated Applicable Imaging Reviewed Interpretation of Laboratory Data incorporated into ED treatment   Plan: Admit  New Prescriptions New Prescriptions   No medications on file     Mancel BaleWentz, Darcee Dekker, MD 12/05/16 1706

## 2016-12-05 NOTE — ED Notes (Signed)
Admitting at bedside 

## 2016-12-05 NOTE — ED Triage Notes (Signed)
Pt brought in by her daughter. She reports she found the patient on the floor this morning asleep next to her bed with bowel incontinence around 0630. The daughter suspects pt fell when trying to get to the bathroom. LSN 8pm. Pt is confused, hx of dementia, saying things that doesn't make sense but daughter reports this is not normal for her. Denies pain, no visible injuries.

## 2016-12-06 DIAGNOSIS — Z888 Allergy status to other drugs, medicaments and biological substances status: Secondary | ICD-10-CM | POA: Diagnosis not present

## 2016-12-06 DIAGNOSIS — I10 Essential (primary) hypertension: Secondary | ICD-10-CM

## 2016-12-06 DIAGNOSIS — E872 Acidosis, unspecified: Secondary | ICD-10-CM | POA: Diagnosis present

## 2016-12-06 DIAGNOSIS — Z79890 Hormone replacement therapy (postmenopausal): Secondary | ICD-10-CM | POA: Diagnosis not present

## 2016-12-06 DIAGNOSIS — Z7982 Long term (current) use of aspirin: Secondary | ICD-10-CM | POA: Diagnosis not present

## 2016-12-06 DIAGNOSIS — G9341 Metabolic encephalopathy: Secondary | ICD-10-CM | POA: Diagnosis present

## 2016-12-06 DIAGNOSIS — F0150 Vascular dementia without behavioral disturbance: Secondary | ICD-10-CM | POA: Diagnosis present

## 2016-12-06 DIAGNOSIS — W19XXXA Unspecified fall, initial encounter: Secondary | ICD-10-CM

## 2016-12-06 DIAGNOSIS — E039 Hypothyroidism, unspecified: Secondary | ICD-10-CM | POA: Diagnosis present

## 2016-12-06 DIAGNOSIS — Z79899 Other long term (current) drug therapy: Secondary | ICD-10-CM | POA: Diagnosis not present

## 2016-12-06 DIAGNOSIS — N179 Acute kidney failure, unspecified: Secondary | ICD-10-CM | POA: Diagnosis present

## 2016-12-06 DIAGNOSIS — W1830XA Fall on same level, unspecified, initial encounter: Secondary | ICD-10-CM | POA: Diagnosis present

## 2016-12-06 DIAGNOSIS — E860 Dehydration: Secondary | ICD-10-CM | POA: Diagnosis present

## 2016-12-06 DIAGNOSIS — G9340 Encephalopathy, unspecified: Secondary | ICD-10-CM | POA: Diagnosis not present

## 2016-12-06 DIAGNOSIS — Z880 Allergy status to penicillin: Secondary | ICD-10-CM | POA: Diagnosis not present

## 2016-12-06 DIAGNOSIS — R4781 Slurred speech: Secondary | ICD-10-CM | POA: Diagnosis present

## 2016-12-06 DIAGNOSIS — E876 Hypokalemia: Secondary | ICD-10-CM | POA: Diagnosis present

## 2016-12-06 LAB — CBC
HCT: 37.4 % (ref 36.0–46.0)
Hemoglobin: 12.4 g/dL (ref 12.0–15.0)
MCH: 30.7 pg (ref 26.0–34.0)
MCHC: 33.2 g/dL (ref 30.0–36.0)
MCV: 92.6 fL (ref 78.0–100.0)
PLATELETS: 188 10*3/uL (ref 150–400)
RBC: 4.04 MIL/uL (ref 3.87–5.11)
RDW: 13.7 % (ref 11.5–15.5)
WBC: 5.4 10*3/uL (ref 4.0–10.5)

## 2016-12-06 LAB — URINE CULTURE: Culture: <10000 — AB

## 2016-12-06 LAB — BASIC METABOLIC PANEL
ANION GAP: 8 (ref 5–15)
BUN: 9 mg/dL (ref 6–20)
CALCIUM: 7.6 mg/dL — AB (ref 8.9–10.3)
CO2: 23 mmol/L (ref 22–32)
Chloride: 107 mmol/L (ref 101–111)
Creatinine, Ser: 0.91 mg/dL (ref 0.44–1.00)
GFR calc non Af Amer: 56 mL/min — ABNORMAL LOW (ref >60)
Glucose, Bld: 87 mg/dL (ref 65–99)
POTASSIUM: 3.6 mmol/L (ref 3.5–5.1)
SODIUM: 138 mmol/L (ref 135–145)

## 2016-12-06 LAB — LACTIC ACID, PLASMA: LACTIC ACID, VENOUS: 1.8 mmol/L (ref 0.5–1.9)

## 2016-12-06 NOTE — Care Management Obs Status (Signed)
MEDICARE OBSERVATION STATUS NOTIFICATION   Patient Details  Name: Brittney Yang MRN: [id number] Date of Birth: [birth year]   Medicare Observation Status Notification Given:  Yes    Brittney BaloKelli F Norine Reddington, RN 12/06/2016, 3:35 PM

## 2016-12-06 NOTE — Progress Notes (Signed)
SLP Cancellation Note  Patient Details Name: Brittney AmassVirginia P Hartig MRN: [id number] DOB: [birth year]   Cancelled treatment:       Reason Eval/Treat Not Completed: Fatigue/lethargy limiting ability to participate. Please text or call [phone] if patient becomes more alert before 1600 today.   Ferdinand LangoLeah Indya Oliveria MA, CCC-SLP [phone]    Ferdinand LangoMcCoy Ashar Lewinski [name] 12/06/2016, 9:41 AM

## 2016-12-06 NOTE — Progress Notes (Signed)
[facility]                                                                              Patient Demographics  Brittney Yang, is a 83 y.o. female, DOB - [birth year], [id number]  Admit date - 12/05/2016   Admitting Physician Kathlen ModyVijaya Akula, MD  Outpatient Primary MD for the patient is Barbie BannerWilson, Fred H, MD  Outpatient specialists:   LOS - 0  days   Medical records reviewed and are as summarized below:    Chief Complaint  Patient presents with  . Fall  . Altered Mental Status       Brief summary   Patient is a 83 year old female with hypothyroidism, dementia was brought by family after a fall. Per daughter at the bedside, patient was found on the floor on the left side and couldn't get up. Patient has dementia and was not able to provide any history. Per daughter she was more confused than her baseline and the reported some slurring of speech. She also had infection of the left great toe and was taking Keflex for the last 6 days. In ED, she was found to be dehydrated, labs revealed a lactic acidosis. CT head showed no acute abnormality. Patient was admitted for further workup.   Assessment & Plan    Principal Problem:   Acute metabolic encephalopathyWith underlying history of advanced dementia - Likely worsened due to dehydration - CT head negative. Given report of slurred speech, MRI of the brain was done which showed no acute ischemia, numerous peripherally distributed chronic microhemorrhages most consistent with cerebral amyloid angiopathy, chronic microvascular ischemia - Overnight she received Ativan for agitation, now patient more sedated -PTOT evaluation when alert, may need rehabilitation or skilled nursing facility/higher level of care  - Continue Aricept  -  obtain TSH, folate, B12 - No other infectious source identified, UA negative for UTI  Active Problems:    Fall -PTOT evaluation once alert and awake     AKI (acute kidney injury) (HCC) -  Resolved, creatinine 1.1 at the time of admission improved to 0.9    DehydrationWith lactic acidosis - Lactic acid 4.02 at the time of admission, improved after IV fluid hydration,    Hypothyroidism - Obtain TSH, continue Synthroid   Code Status: Full CODE STATUS DVT Prophylaxis:  Lovenox  Family Communication: Discussed in detail with the patient, all imaging results, lab results explained to the patient's daughter    Disposition Plan:   Time Spent in minutes  25 minutes  Procedures:  MRI brain   Consultants:   None   Antimicrobials:   None    Medications  Scheduled Meds: . aspirin  325 mg Oral QHS  . cephALEXin  500 mg Oral Q12H  . donepezil  10 mg Oral QHS  . enoxaparin (LOVENOX) injection  40 mg Subcutaneous Q24H  . levothyroxine  75 mcg Oral QAC breakfast  . risperiDONE  0.5 mg Oral BID   Continuous Infusions: PRN Meds:.acetaminophen, LORazepam   Antibiotics   Anti-infectives    Start     Dose/Rate Route Frequency Ordered Stop   12/05/16 2200  cephALEXin (KEFLEX) capsule 500  mg     500 mg Oral Every 12 hours 12/05/16 1856          Subjective:   Brittney Yang was seen and examined today.  Currently somnolent, had received Ativan overnight, daughter at the bedside opens eyes to her name and loud voice   Objective:   Vitals:   12/05/16 2000 12/06/16 0000 12/06/16 0400 12/06/16 1000  BP: (!) 143/91 (!) 136/93 (!) 144/99 (!) 157/74  Pulse: 79 (!) 112 78 95  Resp: 18 18 18    Temp: 98.9 F (37.2 C) 97.7 F (36.5 C) 97.6 F (36.4 C) 98.6 F (37 C)  TempSrc: Oral Oral Axillary Axillary  SpO2: 100% 98% 98% 99%  Weight: 47.1 kg (103 lb 14.4 oz)     Height: 4\' 9"  (1.448 m)       Intake/Output Summary (Last 24 hours) at 12/06/16 1215 Last data filed at 12/06/16 0630  Gross per 24 hour  Intake          4017.33 ml  Output             1100 ml  Net          2917.33 ml     Wt Readings from Last 3 Encounters:  12/05/16 47.1 kg (103 lb 14.4 oz)    06/19/16 58.8 kg (129 lb 9.6 oz)  02/13/16 59 kg (130 lb)     Exam  General: lethargic, somnolent, opens eyes to loud voice   Eyes: PERRLA, EOMI, Anicteric Sclera,  HEENT:  Atraumatic, normocephalic, normal oropharynx  Cardiovascular: S1 S2 auscultated, no rubs, murmurs or gallops. Regular rate and rhythm.  Respiratory: Clear to auscultation bilaterally, no wheezing, rales or rhonchi  Gastrointestinal: Soft, nontender, nondistended, + bowel sounds  Ext: no pedal edema bilaterally  Neuro: unable to assess   Musculoskeletal: No digital cyanosis, clubbing  Skin: No rashes  Psych: Somnolent    Data Reviewed:  I have personally reviewed following labs and imaging studies  Micro Results Recent Results (from the past 240 hour(s))  Urine culture     Status: Abnormal   Collection Time: 12/05/16  3:23 PM  Result Value Ref Range Status   Specimen Description URINE, CLEAN CATCH  Final   Special Requests NONE  Final   Culture <10,000 COLONIES/mL INSIGNIFICANT GROWTH (A)  Final   Report Status 12/06/2016 FINAL  Final    Radiology Reports Ct Head Wo Contrast  Result Date: 12/05/2016 CLINICAL DATA:  Altered mental status. Found on floor. History of dementia. EXAM: CT HEAD WITHOUT CONTRAST TECHNIQUE: Contiguous axial images were obtained from the base of the skull through the vertex without intravenous contrast. COMPARISON:  02/24/2016 FINDINGS: Brain: There is no evidence of acute infarct, intracranial hemorrhage, mass, midline shift, or extra-axial fluid collection. Mild cerebral atrophy is unchanged. Deep cerebral white matter hypodensities are similar to the prior study and nonspecific but compatible with mild chronic small vessel ischemic disease. Vascular: Calcified atherosclerosis at the skullbase. No hyperdense vessel. Skull: No fracture or focal osseous lesion. Sinuses/Orbits: Visualized paranasal sinuses are clear. There is mild chronic air cell opacification at the left  mastoid tip with osseous sclerosis. No acute orbital abnormality. Other: None. IMPRESSION: 1. No evidence of acute intracranial abnormality. 2. Mild chronic small vessel ischemic disease. Electronically Signed   By: Sebastian Ache M.D.   On: 12/05/2016 13:27   Mr Brain Wo Contrast  Result Date: 12/05/2016 CLINICAL DATA:  Slurred speech EXAM: MRI HEAD WITHOUT CONTRAST TECHNIQUE: Multiplanar, multiecho pulse sequences  of the brain and surrounding structures were obtained without intravenous contrast. COMPARISON:  Head CT 12/13/2016 FINDINGS: Brain: The midline structures are normal. There is no focal diffusion restriction to indicate acute infarct. There is multifocal hyperintense T2-weighted signal within the periventricular white matter, most often seen in the setting of chronic microvascular ischemia. Old lacunar infarct of the left basal ganglia. There are numerous chronic micro hemorrhages in a peripheral distribution. Brain volume is normal for age without age-advanced or lobar predominant atrophy. The dura is normal and there is no extra-axial collection. Vascular: Major intracranial arterial and venous sinus flow voids are preserved. Skull and upper cervical spine: The visualized skull base, calvarium, upper cervical spine and extracranial soft tissues are normal. Sinuses/Orbits: No fluid levels or advanced mucosal thickening. No mastoid effusion. Normal orbits. IMPRESSION: 1. No acute ischemia. 2. Numerous peripherally distributed chronic micro hemorrhages, most consistent with cerebral amyloid angiopathy. 3. Periventricular white matter hyperintensity, most commonly chronic microvascular ischemia. Electronically Signed   By: Deatra Robinson M.D.   On: 12/05/2016 19:52   Dg Chest Port 1 View  Result Date: 12/05/2016 CLINICAL DATA:  Fall, left-sided weakness, initial encounter. EXAM: PORTABLE CHEST 1 VIEW COMPARISON:  02/24/2016. FINDINGS: Trachea is midline. Heart size stable. Thoracic aorta is calcified.  Lungs are somewhat low in volume but clear. No pneumothorax. IMPRESSION: No acute findings. Electronically Signed   By: Leanna Battles M.D.   On: 12/05/2016 12:41    Lab Data:  CBC:  Recent Labs Lab 12/05/16 1124 12/05/16 1151 12/06/16 0513  WBC 6.8  --  5.4  NEUTROABS 4.8  --   --   HGB 13.3 13.9 12.4  HCT 39.0 41.0 37.4  MCV 91.3  --  92.6  PLT 238  --  188   Basic Metabolic Panel:  Recent Labs Lab 12/05/16 1124 12/05/16 1151 12/06/16 0513  NA 138 140 138  K 3.8 4.0 3.6  CL 105 104 107  CO2 22  --  23  GLUCOSE 118* 120* 87  BUN 15 20 9   CREATININE 1.26* 1.10* 0.91  CALCIUM 8.9  --  7.6*   GFR: Estimated Creatinine Clearance: 29.4 mL/min (by C-G formula based on SCr of 0.91 mg/dL). Liver Function Tests:  Recent Labs Lab 12/05/16 1124  AST 33  ALT 13*  ALKPHOS 55  BILITOT 0.5  PROT 7.1  ALBUMIN 3.7   No results for input(s): LIPASE, AMYLASE in the last 168 hours. No results for input(s): AMMONIA in the last 168 hours. Coagulation Profile:  Recent Labs Lab 12/05/16 2020  INR 0.99   Cardiac Enzymes: No results for input(s): CKTOTAL, CKMB, CKMBINDEX, TROPONINI in the last 168 hours. BNP (last 3 results) No results for input(s): PROBNP in the last 8760 hours. HbA1C: No results for input(s): HGBA1C in the last 72 hours. CBG:  Recent Labs Lab 12/05/16 1220  GLUCAP 95   Lipid Profile: No results for input(s): CHOL, HDL, LDLCALC, TRIG, CHOLHDL, LDLDIRECT in the last 72 hours. Thyroid Function Tests: No results for input(s): TSH, T4TOTAL, FREET4, T3FREE, THYROIDAB in the last 72 hours. Anemia Panel: No results for input(s): VITAMINB12, FOLATE, FERRITIN, TIBC, IRON, RETICCTPCT in the last 72 hours. Urine analysis:    Component Value Date/Time   COLORURINE STRAW (A) 12/05/2016 1523   APPEARANCEUR CLEAR 12/05/2016 1523   LABSPEC 1.005 12/05/2016 1523   PHURINE 7.0 12/05/2016 1523   GLUCOSEU NEGATIVE 12/05/2016 1523   HGBUR SMALL (A) 12/05/2016  1523   BILIRUBINUR NEGATIVE 12/05/2016 1523  KETONESUR NEGATIVE 12/05/2016 1523   PROTEINUR NEGATIVE 12/05/2016 1523   UROBILINOGEN 0.2 09/22/2014 1445   NITRITE NEGATIVE 12/05/2016 1523   LEUKOCYTESUR TRACE (A) 12/05/2016 1523     Brittney Yang M.D. [facility] 12/06/2016, 12:15 PM  Pager: [phone] Between 7am to 7pm - call Pager - [phone]  After 7pm go to [email or web address] - password [email or web address]  Call night coverage person covering after 7pm

## 2016-12-06 NOTE — Evaluation (Signed)
Clinical/Bedside Swallow Evaluation Patient Details  Name: Brittney Yang MRN: [id number] Date of Birth: Nov 27, 1927  Today's Date: 12/06/2016 Time: SLP Start Time (ACUTE ONLY): 1338 SLP Stop Time (ACUTE ONLY): 1355 SLP Time Calculation (min) (ACUTE ONLY): 17 min  Past Medical History:  Past Medical History:  Diagnosis Date  . Dementia   . Thyroid disease   . Tremor    Past Surgical History:  Past Surgical History:  Procedure Laterality Date  . THYROID SURGERY  196870   HPI:  83 year old female with PMH fo dementia admitted from home with c/o AMS and weakness s/p fall. MRI negative for CVA. Esophagram complete 02/2016-positive for a moderate to severe concentric   Assessment / Plan / Recommendation Clinical Impression  Patient presents with a functional oropharyngeal swallow. No overt indication of aspiration observed. Per daughter, due to esophageal deficits, patient consumes soft, chopped solids at home. Discussed options for mechanical soft solids. Daughter in agreement. Reviewed additional esophageal precautions. No further SLP needs indicated.  SLP Visit Diagnosis: Dysphagia, unspecified (R13.10)    Aspiration Risk  Mild aspiration risk    Diet Recommendation Dysphagia 3 (Mech soft);Thin liquid   Liquid Administration via: Cup;Straw Medication Administration: Whole meds with liquid Supervision: Staff to assist with self feeding Compensations: Slow rate;Small sips/bites Postural Changes: Seated upright at 90 degrees    Other  Recommendations Oral Care Recommendations: Oral care BID   Follow up Recommendations None        Swallow Study   General HPI: 83 year old female with PMH fo dementia admitted from home with c/o AMS and weakness s/p fall. MRI negative for CVA. Esophagram complete 02/2016-positive for a moderate to severe concentric Type of Study: Bedside Swallow Evaluation Previous Swallow Assessment: see HPI Diet Prior to this Study: Regular;Thin  liquids Temperature Spikes Noted: No Respiratory Status: Room air History of Recent Intubation: No Behavior/Cognition: Alert;Uncooperative;Confused Oral Cavity Assessment: Within Functional Limits Oral Care Completed by SLP: Recent completion by staff Oral Cavity - Dentition: Dentures, top;Dentures, bottom Vision: Functional for self-feeding Self-Feeding Abilities: Needs assist Patient Positioning: Upright in bed Baseline Vocal Quality: Normal Volitional Cough: Cognitively unable to elicit Volitional Swallow: Unable to elicit    Oral/Motor/Sensory Function Overall Oral Motor/Sensory Function: Within functional limits   Ice Chips Ice chips: Not tested   Thin Liquid Thin Liquid: Within functional limits Presentation: Cup;Straw    Nectar Thick Nectar Thick Liquid: Not tested   Honey Thick Honey Thick Liquid: Not tested   Puree Puree: Within functional limits Presentation: Spoon   Solid   GO   Solid: Within functional limits    Functional Assessment Tool Used: skilled clinical judgement Functional Limitations: Swallowing Swallow Current Status ([id number]([id number]): At least 1 percent but less than 20 percent impaired, limited or restricted Swallow Goal Status [phone]([id number]): At least 1 percent but less than 20 percent impaired, limited or restricted Swallow Discharge Status [phone]([id number]): At least 1 percent but less than 20 percent impaired, limited or restricted  Brittney LangoLeah Zyia Kaneko MA, CCC-SLP [phone]  Brittney Yang Brittney Yang 12/06/2016,3:28 PM

## 2016-12-06 NOTE — Progress Notes (Signed)
Patient admitted from home through the ed with C/o AMS and increased  Weakness post fall. Pt has generalize weakness and very confused   Reoriented  And redirected to unit  Place and time of the year and month most of the shift. MRI negative for stroke.  Cardiac monitoring in use. And fall precaution in place..  Prn tylenol  Ant ativan given for pain and agitation.during the night. No acute distress this am. Will continue to monitor.

## 2016-12-06 NOTE — Evaluation (Signed)
Physical Therapy Evaluation Patient Details Name: Brittney Yang MRN: [id number] DOB: 04-30-1928 Today's Date: 12/06/2016   History of Present Illness  83 y.o. female admitted on 12/05/16 for fall and weakness, initially thought to have a stroke, but CT scan and MRI were negative for acute events. Chest X-ray negative. Pt dx with acute metabolic encephalopathy with underlying h/o advanced dementia worsened due to dehydration.  Pt with significant PMH of tremor, and thyroid disease s/p surgery.   Clinical Impression  Pt is anxious and fearful in this unfamiliar hospital environment.  She required heavy mod assist to get EOB and [facility] and [facility].  She could not walk safely and did not respond well (as anticipated) to the novel task of using a RW.  Daughter cannot safely take her home unless she is ambulatory.  SNF, at this time, is her most appropriate d/c destination.  Next session try to see if daughter helping her mobilize would trigger more familiarity and help her do better.  Also, ask about her shoes.     Follow Up Recommendations SNF ([facility] first choice, [facility] )    Equipment Recommendations  None recommended by PT (pt did not respond well to introduction of AD)    Recommendations for Other Services   NA    Precautions / Restrictions Precautions Precautions: Fall Restrictions Weight Bearing Restrictions: No      Mobility  Bed Mobility Overal bed mobility: Needs Assistance Bed Mobility: Supine to Sit;Sit to Supine     Supine to sit: Mod assist;HOB elevated Sit to supine: Mod assist   General bed mobility comments: Mod assist to initiate movement of legs to EOB and to support trunk during transitions to sit.  Pt needed help with trunk and legs to get back into bed and assist with bed in trendelenberg to scoot up.   Transfers Overall transfer level: Needs assistance Equipment used: 1 person hand held assist Transfers: Sit to/from  [employer] CorporationStand;Stand Pivot Transfers Sit to Stand: Mod assist Stand pivot transfers: Mod assist       General transfer comment: Heavy mod assist to support trunk to get to standing and [facility].  Pt with posterior preference over shaky legs.  PT attempted several different ways to get her to stand (holding hands, under arm, with RW, without RW) and pt did best with support around her trunk and pelvis (almost like a hug) for stand pivot.    Ambulation/Gait             General Gait Details: unable to safely at this time.                     Balance Overall balance assessment: Needs assistance Sitting-balance support: Feet supported;Bilateral upper extremity supported Sitting balance-Leahy Scale: Poor Sitting balance - Comments: mod assist EOB due to posterior lean Postural control: Posterior lean Standing balance support: Bilateral upper extremity supported Standing balance-Leahy Scale: Poor Standing balance comment: heavy mod assist with posterior preference.                              Pertinent Vitals/Pain Pain Assessment: No/denies pain    Home Living Family/patient expects to be discharged to:: Private residence Living Arrangements: Children Available Help at Discharge: Family;Available 24 hours/day Type of Home: House Home Access: Stairs to enter Entrance Stairs-Rails: None (daughter serves as her rail) Secretary/administratorntrance Stairs-Number of Steps: 2 Home Layout: One  level        Prior Function Level of Independence: Needs assistance   Gait / Transfers Assistance Needed: supervision, hand held assist to get up the steps, hand held assist for community gait  ADL's / Homemaking Assistance Needed: she helps with her own bathing and dressing and often need to be checked after cleaning up her peri area after a BM.         Hand Dominance   Dominant Hand: Right    Extremity/Trunk Assessment   Upper Extremity Assessment Upper Extremity Assessment: Defer  to OT evaluation    Lower Extremity Assessment Lower Extremity Assessment: Generalized weakness;LLE deficits/detail (difficult to MMT due to cognitive deficits) LLE Deficits / Details: left big toe is swollen and red.     Cervical / Trunk Assessment Cervical / Trunk Assessment: Kyphotic  Communication   Communication: No difficulties  Cognition Arousal/Alertness: Awake/alert Behavior During Therapy: Anxious;Restless Overall Cognitive Status: Impaired/Different from baseline Area of Impairment: Orientation;Attention;Memory;Following commands;Safety/judgement;Awareness;Problem solving                 Orientation Level: Disoriented to;Place;Time;Situation Current Attention Level: Sustained Memory: Decreased recall of precautions;Decreased short-term memory Following Commands: Follows one step commands consistently Safety/Judgement: Decreased awareness of safety;Decreased awareness of deficits Awareness: Intellectual Problem Solving: Decreased initiation;Difficulty sequencing;Requires verbal cues;Requires tactile cues General Comments: Pt is very anxious, per daughter she gets more anxious in streeful situations, she keeps repeating that she wants to go home.       General Comments General comments (skin integrity, edema, etc.): Daughter states she will do better once she brings her shoes and that she cannot take her home unless she is back to walking. left church hymn music playing in room at the end of the session to see if it would calm the patient as she goes to church every sunday and sings in the choir.          Assessment/Plan    PT Assessment Patient needs continued PT services  PT Problem List Decreased strength;Decreased activity tolerance;Decreased balance;Decreased mobility;Decreased knowledge of use of DME;Decreased cognition;Decreased safety awareness;Decreased knowledge of precautions       PT Treatment Interventions DME instruction;Gait training;Stair  training;Functional mobility training;Therapeutic activities;Balance training;Therapeutic exercise;Neuromuscular re-education;Cognitive remediation;Patient/family education    PT Goals (Current goals can be found in the Care Plan section)  Acute Rehab PT Goals Patient Stated Goal: to go home PT Goal Formulation: With family Time For Goal Achievement: 12/20/16 Potential to Achieve Goals: Good    Frequency Min 3X/week   Barriers to discharge   per daughter, she cannot physically handle her unless she can walk prior to d/c       AM-PAC PT "6 Clicks" Daily Activity  Outcome Measure Difficulty turning over in bed (including adjusting bedclothes, sheets and blankets)?: Total Difficulty moving from lying on back to sitting on the side of the bed? : Total Difficulty sitting down on and standing up from a chair with arms (e.g., wheelchair, bedside commode, etc,.)?: Total Help needed moving to and from a bed to chair (including a wheelchair)?: A Lot Help needed walking in hospital room?: Total Help needed climbing 3-5 steps with a railing? : Total 6 Click Score: 7    End of Session   Activity Tolerance: Other (comment);Patient limited by fatigue (limited by fear and anxiety) Patient left: in bed;with call bell/phone within reach;with bed alarm set;with family/visitor present   PT Visit Diagnosis: Unsteadiness on feet (R26.81);Muscle weakness (generalized) (M62.81);Difficulty in walking, not elsewhere classified (R26.2)  Time: 1308-6578 PT Time Calculation (min) (ACUTE ONLY): 40 min   Charges:        Lurena Joiner B. Bailea Beed, PT, DPT [phone]   PT Evaluation $PT Eval Moderate Complexity: 1 Procedure PT Treatments $Therapeutic Activity: 8-22 mins   12/06/2016, 10:04 PM

## 2016-12-07 LAB — BASIC METABOLIC PANEL
Anion gap: 8 (ref 5–15)
BUN: 8 mg/dL (ref 6–20)
CO2: 26 mmol/L (ref 22–32)
Calcium: 8.5 mg/dL — ABNORMAL LOW (ref 8.9–10.3)
Chloride: 105 mmol/L (ref 101–111)
Creatinine, Ser: 0.99 mg/dL (ref 0.44–1.00)
GFR calc Af Amer: 58 mL/min — ABNORMAL LOW (ref >60)
GFR, EST NON AFRICAN AMERICAN: 50 mL/min — AB (ref >60)
GLUCOSE: 108 mg/dL — AB (ref 65–99)
POTASSIUM: 3.2 mmol/L — AB (ref 3.5–5.1)
Sodium: 139 mmol/L (ref 135–145)

## 2016-12-07 LAB — FOLATE: Folate: 21 ng/mL (ref >5.9)

## 2016-12-07 LAB — CBC
HEMATOCRIT: 41.1 % (ref 36.0–46.0)
Hemoglobin: 13.6 g/dL (ref 12.0–15.0)
MCH: 30.6 pg (ref 26.0–34.0)
MCHC: 33.1 g/dL (ref 30.0–36.0)
MCV: 92.4 fL (ref 78.0–100.0)
Platelets: 223 10*3/uL (ref 150–400)
RBC: 4.45 MIL/uL (ref 3.87–5.11)
RDW: 13.7 % (ref 11.5–15.5)
WBC: 8.7 10*3/uL (ref 4.0–10.5)

## 2016-12-07 LAB — VITAMIN B12: VITAMIN B 12: 288 pg/mL (ref 180–914)

## 2016-12-07 LAB — TSH: TSH: 2.147 u[IU]/mL (ref 0.350–4.500)

## 2016-12-07 MED ORDER — POTASSIUM CHLORIDE CRYS ER 20 MEQ PO TBCR
40.0000 meq | EXTENDED_RELEASE_TABLET | Freq: Once | ORAL | Status: AC
  Administered 2016-12-07: 40 meq via ORAL
  Filled 2016-12-07: qty 2

## 2016-12-07 MED ORDER — ENOXAPARIN SODIUM 30 MG/0.3ML ~~LOC~~ SOLN
30.0000 mg | SUBCUTANEOUS | Status: DC
  Administered 2016-12-08 – 2016-12-09 (×2): 30 mg via SUBCUTANEOUS
  Filled 2016-12-07 (×2): qty 0.3

## 2016-12-07 NOTE — Progress Notes (Signed)
Initial Nutrition Assessment  DOCUMENTATION CODES:   Not applicable  INTERVENTION:    Downgrade to Dysphagia 2, thin liquids per daughter request   Mighty Shake II TID with meals, each supplement provides 480-500 kcals and 20-23 grams of protein  NUTRITION DIAGNOSIS:   Inadequate oral intake related to dysphagia (confusion ) as evidenced by per patient/family report  GOAL:   Patient will meet greater than or equal to 90% of their needs  MONITOR:   PO intake, Supplement acceptance, Labs, Weight trends, Skin, I & O's  REASON FOR ASSESSMENT:   Malnutrition Screening Tool  ASSESSMENT:   "83 yo Female with PMH fo dementia admitted from home with c/o AMS and weakness s/p fall. MRI negative for CVA." Copied from SLP note, McCoy, Leah M, 12/06/16.  RD spoke with pt's daughter at bedside. Reports pt typically has a good appetite.   States pt received a chicken salad sandwich for lunch which she could not consume.  Labs and medications reviewed. K 3.2 (L), Weight has been stable. CBG 95.  Unable to complete Nutrition-Focused physical exam at this time.  Pt confused, saying she's going to get out of bed.  Diet Order:  DIET DYS 3 Room service appropriate? Yes; Fluid consistency: Thin  Skin:  Reviewed, no issues  Last BM:  6/21  Height:   Ht Readings from Last 1 Encounters:  12/05/16 4\' 9"  (1.448 m)   Weight:   Wt Readings from Last 1 Encounters:  12/05/16 103 lb 14.4 oz (47.1 kg)   Ideal Body Weight:  43.1 kg  BMI:  Body mass index is 22.48 kg/m.  Estimated Nutritional Needs:   Kcal:  1200-1350  Protein:  60-70 gm  Fluid:  >/= 1.5 L  EDUCATION NEEDS:   No education needs identified at this time  Maureen ChattersKatie Ronit Marczak, RD, LDN Pager #: [phone] After-Hours Pager #: [phone]

## 2016-12-07 NOTE — Evaluation (Signed)
Occupational Therapy Evaluation Patient Details Name: Brittney AmassVirginia P Yang MRN: [id number] DOB: October 03, 1927 Today's Date: 12/07/2016    History of Present Illness (P) 83 y.o. female admitted on 12/05/16 for fall and weakness, initially thought to have a stroke, but CT scan and MRI were negative for acute events. Chest X-ray negative. Pt dx with acute metabolic encephalopathy with underlying h/o advanced dementia worsened due to dehydration.  Pt with significant PMH of tremor, and thyroid disease s/p surgery.    Clinical Impression   This 83 y/o F presents with the above. Pt lives with her daughter, and at baseline was receiving MinA with ADLs and intermittent HHA for functional mobility. Pt currently requires overall MaxA for ADLs. Requires MaxA for bed mobility, and sit<>stand with Pt unable to achieve full standing, anticipate +2 assist for further mobility. Pt with cognitive impairments and demonstrates difficulty answering therapist questions and following directions with Pt also appearing anxious/agitated during session. Pt will benefit from continued acute OT services and will benefit from continued OT services in SNF placement prior to return home to maximize pt's safety and independence with ADLs and functional mobility and to decrease caregiver assist needed. Will follow.     Follow Up Recommendations  SNF;Supervision/Assistance - 24 hour    Equipment Recommendations  3 in 1 bedside commode           Precautions / Restrictions Precautions Precautions: (P) Fall Restrictions Weight Bearing Restrictions: (P) No      Mobility Bed Mobility Overal bed mobility: Needs Assistance Bed Mobility: Supine to Sit;Sit to Supine     Supine to sit: Max assist;HOB elevated Sit to supine: Max assist   General bed mobility comments: MaxA for bringing LEs over EOB and assisting to bring trunk upright, MaxA to support trunk and for LE management during sit to supine, requires +2 assist for scoot to  HOB in supine   Transfers Overall transfer level: Needs assistance Equipment used: 1 person hand held assist Transfers: Sit to/from Stand Sit to Stand: Max assist         General transfer comment: Pt able to clear buttocks from bed with MaxA, unable to achieve full standing due to increased tremors, shakiness during standing. Pt tends to lean L when sitting EOB    Balance Overall balance assessment: Needs assistance Sitting-balance support: Feet supported;Bilateral upper extremity supported Sitting balance-Leahy Scale: Poor Sitting balance - Comments: MinA to maintain balance with intermittent periods of MinGuard assist Postural control: Posterior lean;Left lateral lean Standing balance support: Bilateral upper extremity supported Standing balance-Leahy Scale: Poor Standing balance comment: MaxA to clear buttocks from bed with therapist assist                            ADL either performed or assessed with clinical judgement   ADL Overall ADL's : Needs assistance/impaired Eating/Feeding: Set up;Sitting   Grooming: Wash/dry face;Brushing hair;Set up;Bed level   Upper Body Bathing: Minimal assistance;Sitting   Lower Body Bathing: Maximal assistance;Sitting/lateral leans   Upper Body Dressing : Minimal assistance;Sitting   Lower Body Dressing: Total assistance;Bed level Lower Body Dressing Details (indicate cue type and reason): doffing shoes  Toilet Transfer: Maximal assistance;+2 for physical assistance;Squat-pivot;BSC   Toileting- Clothing Manipulation and Hygiene: Total assistance;+2 for physical assistance;Sitting/lateral lean       Functional mobility during ADLs: Maximal assistance General ADL Comments: Pt with impaired cognition and difficulty following commands, able to sit EOB for short duration during OT eval  and complete groomding ADLs at bed level with setup     Vision Baseline Vision/History: Wears glasses Wears Glasses: At all times Vision  Assessment?: Vision impaired- to be further tested in functional context Additional Comments: difficult to assess due to impaired cognition                 Pertinent Vitals/Pain Pain Assessment: (P) Faces Faces Pain Scale: No hurt     Hand Dominance Right   Extremity/Trunk Assessment Upper Extremity Assessment Upper Extremity Assessment: Generalized weakness;Difficult to assess due to impaired cognition   Lower Extremity Assessment Lower Extremity Assessment: Defer to PT evaluation   Cervical / Trunk Assessment Cervical / Trunk Assessment: Kyphotic   Communication Communication Communication: No difficulties   Cognition Arousal/Alertness: Awake/alert Behavior During Therapy: Restless;Agitated;Anxious Overall Cognitive Status: Impaired/Different from baseline Area of Impairment: Orientation;Attention;Memory;Following commands;Safety/judgement;Awareness;Problem solving                 Orientation Level: Disoriented to;Place;Time;Situation Current Attention Level: Sustained Memory: Decreased recall of precautions;Decreased short-term memory Following Commands: Follows one step commands inconsistently Safety/Judgement: Decreased awareness of safety;Decreased awareness of deficits Awareness: Intellectual Problem Solving: Decreased initiation;Difficulty sequencing;Requires verbal cues;Requires tactile cues     General Comments  daughter and son present during session               Home Living Family/patient expects to be discharged to:: Private residence Living Arrangements: Children Available Help at Discharge: Family;Available 24 hours/day Type of Home: House Home Access: Stairs to enter [employer] of Steps: 2 Entrance Stairs-Rails: None (daughter provides assist) Home Layout: One level     Bathroom Shower/Tub: Tub only (bathes at sink level )   Firefighter: Standard     Home Equipment: None          Prior  Functioning/Environment Level of Independence: Needs assistance  Gait / Transfers Assistance Needed: supervision, hand held assist to get up the steps, hand held assist for community gait ADL's / Homemaking Assistance Needed: Per Pt's daughter report, daughter provides setup assist and supervision for bathing and dressing, provides assist to thread LEs in pants, however Pt completes majority of tasks as she is able; reports Pt often need to be checked after cleaning up her peri area after a BM. Pt completes washing up at sink in bathroom            OT Problem List: Decreased strength;Impaired balance (sitting and/or standing);Decreased cognition;Decreased safety awareness;Decreased range of motion;Decreased activity tolerance      OT Treatment/Interventions: Self-care/ADL training;DME and/or AE instruction;Therapeutic activities;Balance training;Therapeutic exercise;Patient/family education;Cognitive remediation/compensation    OT Goals(Current goals can be found in the care plan section) Acute Rehab OT Goals Patient Stated Goal: to go home OT Goal Formulation: With patient Time For Goal Achievement: 12/21/16 Potential to Achieve Goals: Fair ADL Goals Pt Will Perform Grooming: with set-up;sitting Pt Will Perform Upper Body Bathing: sitting;with min guard assist Pt Will Perform Upper Body Dressing: with min guard assist;sitting Pt Will Perform Lower Body Dressing: sit to/from stand;with max assist Pt Will Transfer to Toilet: with max assist;stand pivot transfer;bedside commode Pt Will Perform Toileting - Clothing Manipulation and hygiene: with max assist;sit to/from stand  OT Frequency: Min 2X/week                             AM-PAC PT "6 Clicks" Daily Activity     Outcome Measure Help from another person eating meals?: A Little Help  from another person taking care of personal grooming?: A Lot Help from another person toileting, which includes using toliet, bedpan, or  urinal?: Total Help from another person bathing (including washing, rinsing, drying)?: A Lot Help from another person to put on and taking off regular upper body clothing?: A Lot Help from another person to put on and taking off regular lower body clothing?: A Lot 6 Click Score: 12   End of Session Equipment Utilized During Treatment: Gait belt  Activity Tolerance: Patient tolerated treatment well Patient left: in bed;with call bell/phone within reach;with bed alarm set;with family/visitor present  OT Visit Diagnosis: Muscle weakness (generalized) (M62.81);Unsteadiness on feet (R26.81);Other symptoms and signs involving cognitive function                Time: 1202-1227 OT Time Calculation (min): 25 min Charges:  OT General Charges $OT Visit: 1 Procedure OT Evaluation $OT Eval Low Complexity: 1 Procedure G-Codes:     Marcy Siren, OT Pager [phone] 12/07/2016   Orlando Penner 12/07/2016, 1:13 PM

## 2016-12-07 NOTE — Progress Notes (Addendum)
Physical Therapy Treatment Patient Details Name: Brittney Yang MRN: [id number] DOB: [birth year] Today's Date: 12/07/2016    History of Present Illness 83 y.o. female admitted on 12/05/16 for fall and weakness, initially thought to have a stroke, but CT scan and MRI were negative for acute events. Chest X-ray negative. Pt dx with acute metabolic encephalopathy with underlying h/o advanced dementia worsened due to dehydration.  Pt with significant PMH of tremor, and thyroid disease s/p surgery.     PT Comments    Pt seen for mobility progression and making very slow progress. Pt continues to require heavy physical assistance for bed mobility and transfers. Pt very confused and anxious throughout session. Of note, after short distance ambulation, pt's HR elevated to low 120's. Pt would continue to benefit from skilled physical therapy services at this time while admitted and after d/c to address the below listed limitations in order to improve overall safety and independence with functional mobility.    Follow Up Recommendations  SNF     Equipment Recommendations  None recommended by PT    Recommendations for Other Services       Precautions / Restrictions Precautions Precautions: Fall Restrictions Weight Bearing Restrictions: No    Mobility  Bed Mobility Overal bed mobility: Needs Assistance Bed Mobility: Supine to Sit;Sit to Supine     Supine to sit: Max assist;HOB elevated Sit to supine: Total assist;+2 for physical assistance   General bed mobility comments: increased time, cueing for sequencing, use of bed pads to position hips at EOB, max A to elevate trunk and total A to return to supine  Transfers Overall transfer level: Needs assistance Equipment used: 2 person hand held assist Transfers: Sit to/from Stand Sit to Stand: Mod assist;+2 physical assistance         General transfer comment: increased time, tremulous with movement, mod A x2 to rise from bed x3 and  for stability  Ambulation/Gait Ambulation/Gait assistance: Mod assist;+2 physical assistance Ambulation Distance (Feet): 5 Feet Assistive device: 2 person hand held assist Gait Pattern/deviations: Trunk flexed;Narrow base of support;Ataxic Gait velocity: decreased Gait velocity interpretation: <1.8 ft/sec, indicative of risk for recurrent falls General Gait Details: pt able to take side steps at EOB with mod A x2 and max encouragement. pt tremulous throughout body with movement. pt with very flexed posture and unable to correct with verbal or tactile cueing   Stairs            Wheelchair Mobility    Modified Rankin (Stroke Patients Only)       Balance Overall balance assessment: Needs assistance Sitting-balance support: Feet supported;Bilateral upper extremity supported Sitting balance-Leahy Scale: Poor Sitting balance - Comments: min A to close min guard to maintain sitting EOB Postural control: Posterior lean;Left lateral lean Standing balance support: Bilateral upper extremity supported Standing balance-Leahy Scale: Poor Standing balance comment: mod A x2                            Cognition Arousal/Alertness: Awake/alert Behavior During Therapy: Restless;Agitated;Anxious Overall Cognitive Status: Impaired/Different from baseline Area of Impairment: Orientation;Attention;Memory;Following commands;Safety/judgement;Awareness;Problem solving                 Orientation Level: Disoriented to;Place;Time;Situation Current Attention Level: Sustained Memory: Decreased recall of precautions;Decreased short-term memory Following Commands: Follows one step commands inconsistently Safety/Judgement: Decreased awareness of safety;Decreased awareness of deficits Awareness: Intellectual Problem Solving: Decreased initiation;Difficulty sequencing;Requires verbal cues;Requires tactile cues General Comments: pt very anxious  throughout, wanting to walk and go home,  calling out for people that were not present      Exercises      General Comments General comments (skin integrity, edema, etc.): daughter and son present during session      Pertinent Vitals/Pain Pain Assessment: Faces Faces Pain Scale: No hurt    Home Living Family/patient expects to be discharged to:: Private residence Living Arrangements: Children Available Help at Discharge: Family;Available 24 hours/day Type of Home: House Home Access: Stairs to enter Entrance Stairs-Rails: None (daughter provides assist) Home Layout: One level Home Equipment: None      Prior Function Level of Independence: Needs assistance  Gait / Transfers Assistance Needed: supervision, hand held assist to get up the steps, hand held assist for community gait ADL's / Homemaking Assistance Needed: Per Pt's daughter report, daughter provides setup assist and supervision for bathing and dressing, provides assist to thread LEs in pants, however Pt completes majority of tasks as she is able; reports Pt often need to be checked after cleaning up her peri area after a BM. Pt completes washing up at sink in bathroom     PT Goals (current goals can now be found in the care plan section) Acute Rehab PT Goals Patient Stated Goal: to go home PT Goal Formulation: With family Time For Goal Achievement: 12/20/16 Potential to Achieve Goals: Fair Progress towards PT goals: Progressing toward goals    Frequency    Min 3X/week      PT Plan Current plan remains appropriate    Co-evaluation              AM-PAC PT "6 Clicks" Daily Activity  Outcome Measure  Difficulty turning over in bed (including adjusting bedclothes, sheets and blankets)?: Total Difficulty moving from lying on back to sitting on the side of the bed? : Total Difficulty sitting down on and standing up from a chair with arms (e.g., wheelchair, bedside commode, etc,.)?: Total Help needed moving to and from a bed to chair (including a  wheelchair)?: A Lot Help needed walking in hospital room?: A Lot Help needed climbing 3-5 steps with a railing? : Total 6 Click Score: 8    End of Session Equipment Utilized During Treatment: Gait belt Activity Tolerance: Patient tolerated treatment well Patient left: in bed;with call bell/phone within reach;with bed alarm set;with family/visitor present Nurse Communication: Mobility status;Other (comment) (pt's IV leaking and HR response) PT Visit Diagnosis: Unsteadiness on feet (R26.81);Muscle weakness (generalized) (M62.81);Difficulty in walking, not elsewhere classified (R26.2)     Time: [id number] PT Time Calculation (min) (ACUTE ONLY): 24 min  Charges:  $Therapeutic Activity: 23-37 mins                    G Codes:       Oahe AcresJennifer Stephenia Vogan, [place] CarolinaPT, [place]PT [phone]    Alessandra BevelsJennifer M Lavontae Cornia 12/07/2016, 1:17 PM

## 2016-12-07 NOTE — [place] FL2 (Signed)
[place] MEDICAID FL2 LEVEL OF CARE SCREENING TOOL     IDENTIFICATION  Patient Name: Brittney Yang Birthdate: [birth year] Sex: female Admission Date (Current Location): 12/05/2016  [facility] and [place] Number:  Producer, television/film/video and Address:  [facility], [address] [place], [place] [address]      Provider Number: [id number]  Attending Physician Name and Address:  Cathren Harsh, MD  Relative Name and Phone Number:  Lynden Ang, daughter, [phone]    Current Level of Care: Hospital Recommended Level of Care: Skilled Nursing Facility Prior Approval Number:    Date Approved/Denied:   PASRR Number: [id number]  Discharge Plan: SNF    Current Diagnoses: Patient Active Problem List   Diagnosis Date Noted  . Acute metabolic encephalopathy 12/06/2016  . AKI (acute kidney injury) (HCC) 12/06/2016  . Dehydration 12/06/2016  . Hypothyroidism 12/06/2016  . Lactic acidosis 12/06/2016  . Fall 12/05/2016  . Hypothyroidism, postradioiodine therapy 07/05/2014  . Hypotension 05/02/2014  . Hypothermia 05/02/2014  . 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 Dementia without behavioral disturbance 01/01/2014  . Postsurgical hypothyroidism 03/31/2013    Orientation RESPIRATION BLADDER Height & Weight     Self  Normal Incontinent Weight: 47.1 kg (103 lb 14.4 oz) Height:  4\' 9"  (144.8 cm)  BEHAVIORAL SYMPTOMS/MOOD NEUROLOGICAL BOWEL NUTRITION STATUS      Continent Diet (Please see DC Summary)  AMBULATORY STATUS COMMUNICATION OF NEEDS Skin   Extensive Assist Verbally Normal                       Personal Care Assistance Level of Assistance  Bathing, Feeding, Dressing Bathing Assistance: Maximum assistance Feeding assistance: Limited assistance Dressing Assistance: Maximum assistance     Functional Limitations Info             SPECIAL CARE FACTORS FREQUENCY  PT (By licensed PT)     PT Frequency:  5x/week              Contractures      Additional Factors Info  Code Status, Allergies, Psychotropic Code Status Info: Full Allergies Info: Methimazole Methimazole, Haloperidol, Penicillins Psychotropic Info: Risperdal         Current Medications (12/07/2016):  This is the current hospital active medication list Current Facility-Administered Medications  Medication Dose Route Frequency Provider Last Rate Last Dose  . acetaminophen (TYLENOL) tablet 500 mg  500 mg Oral Daily PRN Kathlen Mody, MD   500 mg at 12/06/16 0118  . aspirin tablet 325 mg  325 mg Oral QHS Kathlen Mody, MD   325 mg at 12/06/16 2142  . cephALEXin (KEFLEX) capsule 500 mg  500 mg Oral Q12H Kathlen Mody, MD   500 mg at 12/06/16 2142  . donepezil (ARICEPT) tablet 10 mg  10 mg Oral QHS Kathlen Mody, MD   10 mg at 12/06/16 2142  . enoxaparin (LOVENOX) injection 40 mg  40 mg Subcutaneous Q24H Kathlen Mody, MD      . levothyroxine (SYNTHROID, LEVOTHROID) tablet 75 mcg  75 mcg Oral QAC breakfast Kathlen Mody, MD      . LORazepam (ATIVAN) tablet 0.5 mg  0.5 mg Oral TID PRN Kathlen Mody, MD   0.5 mg at 12/06/16 0118  . risperiDONE (RISPERDAL) tablet 0.5 mg  0.5 mg Oral BID Kathlen Mody, MD   0.5 mg at 12/06/16 2142     Discharge Medications: Please see discharge summary for a list of discharge  medications.  Relevant Imaging Results:  Relevant Lab Results:   Additional Information SSn: [id number]  Rooney Gladwin S HazletonRayyan, ConnecticutLCSWA

## 2016-12-07 NOTE — Progress Notes (Signed)
[facility]                                                                              Patient Demographics  Brittney Yang, is a 83 y.o. female, DOB - Sep 07, 1927, OZD:[id number]  Admit date - 12/05/2016   Admitting Physician Kathlen Mody, MD  Outpatient Primary MD for the patient is [name], MD  Outpatient specialists:   LOS - 1  days   Medical records reviewed and are as summarized below:    Chief Complaint  Patient presents with  . Fall  . Altered Mental Status       Brief summary   Patient is a 83 year old female with hypothyroidism, dementia was brought by family after a fall. Per daughter at the bedside, patient was found on the floor on the left side and couldn't get up. Patient has dementia and was not able to provide any history. Per daughter she was more confused than her baseline and the reported some slurring of speech. She also had infection of the left great toe and was taking Keflex for the last 6 days. In ED, she was found to be dehydrated, labs revealed a lactic acidosis. CT head showed no acute abnormality. Patient was admitted for further workup.   Assessment & Plan    Principal Problem:   Acute metabolic encephalopathyWith underlying history of advanced dementia - Likely worsened due to dehydration, Improving today, alert and oriented 2, getting closer to baseline per daughter at the bedside - CT head negative. Given report of slurred speech, MRI of the brain was done which showed no acute ischemia, numerous peripherally distributed chronic microhemorrhages most consistent with cerebral amyloid angiopathy, chronic microvascular ischemia - Continue Aricept  - TSH, folate, B12 within normal limits  - No other infectious source identified, UA negative for UTI  Active Problems:    Fall -PTOT evaluation recommended skilled nursing facility     AKI (acute kidney injury) (HCC) - Resolved, creatinine 1.1 at the time of admission  improved to 0.9    Dehydration with lactic acidosis - Lactic acid 4.02 at the time of admission, improved after IV fluid hydration,    Hypothyroidism - TSH 2.1, continue Synthroid   Hypokalemia Replaced  Code Status: Full CODE STATUS DVT Prophylaxis:  Lovenox  Family Communication: Discussed in detail with the patient, all imaging results, lab results explained to the patient's daughter    Disposition Plan: Will need skilled nursing facility  Time Spent in minutes  25 minutes  Procedures:  MRI brain   Consultants:   None   Antimicrobials:   None    Medications  Scheduled Meds: . aspirin  325 mg Oral QHS  . cephALEXin  500 mg Oral Q12H  . donepezil  10 mg Oral QHS  . enoxaparin (LOVENOX) injection  40 mg Subcutaneous Q24H  . levothyroxine  75 mcg Oral QAC breakfast  . risperiDONE  0.5 mg Oral BID   Continuous Infusions: PRN Meds:.acetaminophen, LORazepam   Antibiotics   Anti-infectives    Start     Dose/Rate Route Frequency Ordered Stop   12/05/16 2200  cephALEXin (KEFLEX) capsule 500 mg  500 mg Oral Every 12 hours 12/05/16 1856          Subjective:   [name] was seen and examined today. Much more alert and awake today, oriented 2, recognizes her daughter, able to tell her date of birth. Denies any pain. No fevers or chills nausea vomiting. No acute issues overnight.    Objective:   Vitals:   12/06/16 1851 12/06/16 2048 12/07/16 0519 12/07/16 0941  BP: (!) 170/84 (!) 158/87 138/85 130/84  Pulse: 100 90 79 87  Resp: 20 20 18 20   Temp: 97.6 F (36.4 C) 97.8 F (36.6 C) 98.1 F (36.7 C) 98.5 F (36.9 C)  TempSrc: Oral Oral Oral Oral  SpO2: 98% 95% 96% 97%  Weight:      Height:       No intake or output data in the 24 hours ending 12/07/16 1247   Wt Readings from Last 3 Encounters:  12/05/16 47.1 kg (103 lb 14.4 oz)  06/19/16 58.8 kg (129 lb 9.6 oz)  02/13/16 59 kg (130 lb)     Exam  General: Alert, awake, oriented  2  Eyes: EOMI, PERRLA  HEENT:  normocephalic, atraumatic  Cardiovascular: S1 and S2 clear, no MRG, RRR  Respiratory: CTAB  Gastrointestinal: Soft, NT, ND, NBS  Ext: no pedal edema bilaterally  Neuro: moving all extremities  Musculoskeletal: No cyanosis or clubbing  Skin: No rashes  Psych: alert and oriented 2, still somewhat confused   Data Reviewed:  I have personally reviewed following labs and imaging studies  Micro Results Recent Results (from the past 240 hour(s))  Urine culture     Status: Abnormal   Collection Time: 12/05/16  3:23 PM  Result Value Ref Range Status   Specimen Description URINE, CLEAN CATCH  Final   Special Requests NONE  Final   Culture <10,000 COLONIES/mL INSIGNIFICANT GROWTH (A)  Final   Report Status 12/06/2016 FINAL  Final  Blood Culture (routine x 2)     Status: None (Preliminary result)   Collection Time: 12/05/16  3:46 PM  Result Value Ref Range Status   Specimen Description BLOOD RIGHT HAND  Final   Special Requests IN PEDIATRIC BOTTLE Blood Culture adequate volume  Final   Culture NO GROWTH < 24 HOURS  Final   Report Status PENDING  Incomplete  Blood Culture (routine x 2)     Status: None (Preliminary result)   Collection Time: 12/05/16  3:50 PM  Result Value Ref Range Status   Specimen Description BLOOD LEFT HAND  Final   Special Requests IN PEDIATRIC BOTTLE Blood Culture adequate volume  Final   Culture NO GROWTH < 24 HOURS  Final   Report Status PENDING  Incomplete    Radiology Reports Ct Head Wo Contrast  Result Date: 12/05/2016 CLINICAL DATA:  Altered mental status. Found on floor. History of dementia. EXAM: CT HEAD WITHOUT CONTRAST TECHNIQUE: Contiguous axial images were obtained from the base of the skull through the vertex without intravenous contrast. COMPARISON:  02/24/2016 FINDINGS: Brain: There is no evidence of acute infarct, intracranial hemorrhage, mass, midline shift, or extra-axial fluid collection. Mild cerebral  atrophy is unchanged. Deep cerebral white matter hypodensities are similar to the prior study and nonspecific but compatible with mild chronic small vessel ischemic disease. Vascular: Calcified atherosclerosis at the skullbase. No hyperdense vessel. Skull: No fracture or focal osseous lesion. Sinuses/Orbits: Visualized paranasal sinuses are clear. There is mild chronic air cell opacification at the left mastoid tip with osseous sclerosis. No  acute orbital abnormality. Other: None. IMPRESSION: 1. No evidence of acute intracranial abnormality. 2. Mild chronic small vessel ischemic disease. Electronically Signed   By: Sebastian AcheAllen  Grady M.D.   On: 12/05/2016 13:27   Mr Brain Wo Contrast  Result Date: 12/05/2016 CLINICAL DATA:  Slurred speech EXAM: MRI HEAD WITHOUT CONTRAST TECHNIQUE: Multiplanar, multiecho pulse sequences of the brain and surrounding structures were obtained without intravenous contrast. COMPARISON:  Head CT 12/13/2016 FINDINGS: Brain: The midline structures are normal. There is no focal diffusion restriction to indicate acute infarct. There is multifocal hyperintense T2-weighted signal within the periventricular white matter, most often seen in the setting of chronic microvascular ischemia. Old lacunar infarct of the left basal ganglia. There are numerous chronic micro hemorrhages in a peripheral distribution. Brain volume is normal for age without age-advanced or lobar predominant atrophy. The dura is normal and there is no extra-axial collection. Vascular: Major intracranial arterial and venous sinus flow voids are preserved. Skull and upper cervical spine: The visualized skull base, calvarium, upper cervical spine and extracranial soft tissues are normal. Sinuses/Orbits: No fluid levels or advanced mucosal thickening. No mastoid effusion. Normal orbits. IMPRESSION: 1. No acute ischemia. 2. Numerous peripherally distributed chronic micro hemorrhages, most consistent with cerebral amyloid angiopathy.  3. Periventricular white matter hyperintensity, most commonly chronic microvascular ischemia. Electronically Signed   By: Deatra RobinsonKevin  Herman M.D.   On: 12/05/2016 19:52   Dg Chest Port 1 View  Result Date: 12/05/2016 CLINICAL DATA:  Fall, left-sided weakness, initial encounter. EXAM: PORTABLE CHEST 1 VIEW COMPARISON:  02/24/2016. FINDINGS: Trachea is midline. Heart size stable. Thoracic aorta is calcified. Lungs are somewhat low in volume but clear. No pneumothorax. IMPRESSION: No acute findings. Electronically Signed   By: Leanna BattlesMelinda  Blietz M.D.   On: 12/05/2016 12:41    Lab Data:  CBC:  Recent Labs Lab 12/05/16 1124 12/05/16 1151 12/06/16 0513 12/07/16 0644  WBC 6.8  --  5.4 8.7  NEUTROABS 4.8  --   --   --   HGB 13.3 13.9 12.4 13.6  HCT 39.0 41.0 37.4 41.1  MCV 91.3  --  92.6 92.4  PLT 238  --  188 223   Basic Metabolic Panel:  Recent Labs Lab 12/05/16 1124 12/05/16 1151 12/06/16 0513 12/07/16 0644  NA 138 140 138 139  K 3.8 4.0 3.6 3.2*  CL 105 104 107 105  CO2 22  --  23 26  GLUCOSE 118* 120* 87 108*  BUN 15 20 9 8   CREATININE 1.26* 1.10* 0.91 0.99  CALCIUM 8.9  --  7.6* 8.5*   GFR: Estimated Creatinine Clearance: 27 mL/min (by C-G formula based on SCr of 0.99 mg/dL). Liver Function Tests:  Recent Labs Lab 12/05/16 1124  AST 33  ALT 13*  ALKPHOS 55  BILITOT 0.5  PROT 7.1  ALBUMIN 3.7   No results for input(s): LIPASE, AMYLASE in the last 168 hours. No results for input(s): AMMONIA in the last 168 hours. Coagulation Profile:  Recent Labs Lab 12/05/16 2020  INR 0.99   Cardiac Enzymes: No results for input(s): CKTOTAL, CKMB, CKMBINDEX, TROPONINI in the last 168 hours. BNP (last 3 results) No results for input(s): PROBNP in the last 8760 hours. HbA1C: No results for input(s): HGBA1C in the last 72 hours. CBG:  Recent Labs Lab 12/05/16 1220  GLUCAP 95   Lipid Profile: No results for input(s): CHOL, HDL, LDLCALC, TRIG, CHOLHDL, LDLDIRECT in the  last 72 hours. Thyroid Function Tests:  Recent Labs  12/07/16 [phone]  TSH 2.147   Anemia Panel:  Recent Labs  12/07/16 0644  VITAMINB12 288  FOLATE 21.0   Urine analysis:    Component Value Date/Time   COLORURINE STRAW (A) 12/05/2016 1523   APPEARANCEUR CLEAR 12/05/2016 1523   LABSPEC 1.005 12/05/2016 1523   PHURINE 7.0 12/05/2016 1523   GLUCOSEU NEGATIVE 12/05/2016 1523   HGBUR SMALL (A) 12/05/2016 1523   BILIRUBINUR NEGATIVE 12/05/2016 1523   KETONESUR NEGATIVE 12/05/2016 1523   PROTEINUR NEGATIVE 12/05/2016 1523   UROBILINOGEN 0.2 09/22/2014 1445   NITRITE NEGATIVE 12/05/2016 1523   LEUKOCYTESUR TRACE (A) 12/05/2016 1523     Salle Brandle M.D. [facility] 12/07/2016, 12:47 PM  Pager: [phone] Between 7am to 7pm - call Pager - [phone]  After 7pm go to [email or web address] - password [email or web address]  Call night coverage person covering after 7pm

## 2016-12-08 ENCOUNTER — Inpatient Hospital Stay (HOSPITAL_COMMUNITY): Payer: Medicare Other

## 2016-12-08 MED ORDER — SODIUM CHLORIDE 0.9 % IV BOLUS (SEPSIS)
500.0000 mL | Freq: Once | INTRAVENOUS | Status: AC
  Administered 2016-12-08: 500 mL via INTRAVENOUS

## 2016-12-08 NOTE — Clinical Social Work Note (Signed)
Clinical Social Work Assessment  Patient Details  Name: Brittney Yang MRN: [id number] Date of Birth: March 01, 1928  Date of referral:  12/08/16               Reason for consult:  Discharge Planning                Permission sought to share information with:  Family Supports Permission granted to share information::  Yes, Verbal Permission Granted  Name::     Brittney Yang  Agency::  SNFs  Relationship::  Daughter  Contact Information:  [phone]  Housing/Transportation Living arrangements for the past 2 months:  [place] of Information:  Adult Children, Patient Patient Interpreter Needed:  None Criminal Activity/Legal Involvement Pertinent to Current Situation/Hospitalization:  No - Comment as needed Significant Relationships:  Adult Children Lives with:  Self Do you feel safe going back to the place where you live?  Yes Need for family participation in patient care:  Yes (Comment)  Care giving concerns:  No care giving concerns identified.    Social Worker assessment / plan:  CSW called by RN stating pt's dtr wants to speak with CSW. CSW met with dtr-Brittney Yang at pt bedside. Pt oriented to self only. CSW introduced herself and explained role of social work. Pt from home and lives alone, with family checking in throughout the day. Dtr wanted to confirm pt will d/c to [facility] on Sunday and asked for a specific time. CSW confirmed pt's d/c to [facility], but explained d/c time will be dependent upon MD. Dtr expressed understanding. CSW will continue to follow.   Employment status:  Retired Forensic scientist:  [employer] PT Recommendations:  [place] / Referral to community resources:  [place]  Patient/Family's Response to care:  Dtr appreciative of CSW support.   Patient/Family's Understanding of and Emotional Response to Diagnosis, Current Treatment, and Prognosis:  Pt only oriented to self. Dtr expressed  understanding of pt's medical state. Dtr hopeful that pt will gain strength at SNF with STR so that she may return home.   Emotional Assessment Appearance:  Appears stated age Attitude/Demeanor/Rapport:  Other (Appropriate) Affect (typically observed):  Accepting Orientation:  Oriented to Self Alcohol / Substance use:  Other Psych involvement (Current and /or in the community):  No (Comment)  Discharge Needs  Concerns to be addressed:  Care Coordination Readmission within the last 30 days:  No Current discharge risk:  Lives alone, Dependent with Mobility Barriers to Discharge:  Continued Medical Work up   [employer], LCSW 12/08/2016, 12:26 PM

## 2016-12-08 NOTE — Progress Notes (Signed)
Patient has been drowsy throughout shift. [facility], patient falling asleep while being helped onto stedy and [facility]. Continues to fall back asleep once no longer being stimulated. VSS. Patient's daughter states this is different than yesterday, states patient is less responsive, not talking as much. Dr. Isidoro Donningai notified. See new orders.

## 2016-12-08 NOTE — Clinical Social Work Placement (Signed)
   CLINICAL SOCIAL WORK PLACEMENT  NOTE  Date:  12/08/2016  Patient Details  Name: Brittney Yang MRN: [id number] Date of Birth: 1928/04/10  Clinical Social Work is seeking post-discharge placement for this patient at the Skilled  Nursing Facility level of care (*CSW will initial, date and re-position this form in  chart as items are completed):  Yes   Patient/family provided with [place] Clinical Social Work Department's list of facilities offering this level of care within the geographic area requested by the patient (or if unable, by the patient's family).  Yes   Patient/family informed of their freedom to choose among providers that offer the needed level of care, that participate in Medicare, Medicaid or managed care program needed by the patient, have an available bed and are willing to accept the patient.  Yes   Patient/family informed of [facility]'s ownership interest in [facility] and [facility], as well as of the fact that they are under no obligation to receive care at these facilities.  PASRR submitted to EDS on 12/07/16     PASRR number received on 12/07/16     Existing PASRR number confirmed on       FL2 transmitted to all facilities in geographic area requested by pt/family on 12/07/16     FL2 transmitted to all facilities within larger geographic area on       Patient informed that his/her managed care company has contracts with or will negotiate with certain facilities, including the following:        Yes   Patient/family informed of bed offers received.  [facility]     Physician recommends and patient chooses bed at      [facility]  .  Patient to be transferred to facility by [facility]     Patient family notified on   of transfer.  Name of family member notified:        PHYSICIAN Please prepare prescriptions, Please prepare priority discharge summary, including medications      Additional Comment:    _______________________________________________ Dominic PeaJeneya G Wil Slape, LCSW 12/08/2016, 12:37 PM

## 2016-12-08 NOTE — Progress Notes (Signed)
Patient has been drowsy all night.  Patient will awake with stimulation however falls right back to sleep when stimulation stopped.    RN could not get patient to stay awake for long enough periods to safely administer medications.  RN will continue to monitor patient

## 2016-12-08 NOTE — Progress Notes (Signed)
[facility]                                                                              Patient Demographics  Brittney Yang, is a 83 y.o. female, DOB - August 26, 1927, ZOX:[id number]  Admit date - 12/05/2016   Admitting Physician Kathlen Mody, MD  Outpatient Primary MD for the patient is [name], MD  Outpatient specialists:   LOS - 2  days   Medical records reviewed and are as summarized below:    Chief Complaint  Patient presents with  . Fall  . Altered Mental Status       Brief summary   Patient is a 83 year old female with hypothyroidism, dementia was brought by family after a fall. Per daughter at the bedside, patient was found on the floor on the left side and couldn't get up. Patient has dementia and was not able to provide any history. Per daughter she was more confused than her baseline and the reported some slurring of speech. She also had infection of the left great toe and was taking Keflex for the last 6 days. In ED, she was found to be dehydrated, labs revealed a lactic acidosis. CT head showed no acute abnormality. Patient was admitted for further workup.   Assessment & Plan    Principal Problem:   Acute metabolic encephalopathyWith underlying history of advanced dementia - Likely worsened due to dehydration - CT head negative. Given report of slurred speech, MRI of the brain was done which showed no acute ischemia, numerous peripherally distributed chronic microhemorrhages most consistent with cerebral amyloid angiopathy, chronic microvascular ischemia - Continue Aricept  - TSH, folate, B12 within normal limits  - No other infectious source identified, UA negative for UTI - Improving, closer to baseline, plan for skilled nursing facility in a.m.  Active Problems:    Fall -PTOT evaluation recommended skilled nursing facility     AKI (acute kidney injury) (HCC) - Resolved, creatinine 1.1 at the time of admission improved to 0.9   Dehydration with lactic acidosis - Lactic acid 4.02 at the time of admission, improved after IV fluid hydration,    Hypothyroidism - TSH 2.1, continue Synthroid   Hypokalemia Replaced  Code Status: Full CODE STATUS DVT Prophylaxis:  Lovenox  Family Communication: Discussed with daughter, plan for skilled nursing facility in a.m.   Disposition Plan: SNF in am   Time Spent in minutes  25 minutes  Procedures:  MRI brain   Consultants:   None   Antimicrobials:   None    Medications  Scheduled Meds: . aspirin  325 mg Oral QHS  . cephALEXin  500 mg Oral Q12H  . donepezil  10 mg Oral QHS  . enoxaparin (LOVENOX) injection  30 mg Subcutaneous Q24H  . levothyroxine  75 mcg Oral QAC breakfast  . risperiDONE  0.5 mg Oral BID   Continuous Infusions: PRN Meds:.acetaminophen, LORazepam   Antibiotics   Anti-infectives    Start     Dose/Rate Route Frequency Ordered Stop   12/05/16 2200  cephALEXin (KEFLEX) capsule 500 mg     500 mg Oral Every 12 hours 12/05/16 1856  Subjective:   Brittney BustardVirginia Yang was seen and examined today. Alert, awake, closer to her baseline. Denies any pain. No issues overnight. No fevers or chills nausea vomiting. Tolerating diet  Objective:   Vitals:   12/07/16 2100 12/08/16 0114 12/08/16 0500 12/08/16 1018  BP: 139/74 (!) 112/59 (!) 159/77 (!) 147/68  Pulse: 96 87 88 82  Resp: 20 20 20 18   Temp: 98.6 F (37 C) 97.5 F (36.4 C) 97.5 F (36.4 C) 97.7 F (36.5 C)  TempSrc: Oral Oral Oral Oral  SpO2: 98% 96% 95% 94%  Weight:      Height:       No intake or output data in the 24 hours ending 12/08/16 1417   Wt Readings from Last 3 Encounters:  12/05/16 47.1 kg (103 lb 14.4 oz)  06/19/16 58.8 kg (129 lb 9.6 oz)  02/13/16 59 kg (130 lb)     Exam  General: Alert, awake, oriented 2  Eyes: PERRLA, EOMI  HEENT:  normocephalic, atraumatic  Cardiovascular: S1 and S2 clear, RRR  Respiratory: Clear to auscultation  bilaterally  Gastrointestinal: Soft, nontender, nondistended, normal bowel sounds  Ext: no pedal edema  Neuro: moving all extremities  Musculoskeletal: No cyanosis, clubbing   Skin: No rashes Psych: getting closer to her baseline, still somewhat confused, alert and oriented 2   Data Reviewed:  I have personally reviewed following labs and imaging studies  Micro Results Recent Results (from the past 240 hour(s))  Urine culture     Status: Abnormal   Collection Time: 12/05/16  3:23 PM  Result Value Ref Range Status   Specimen Description URINE, CLEAN CATCH  Final   Special Requests NONE  Final   Culture <10,000 COLONIES/mL INSIGNIFICANT GROWTH (A)  Final   Report Status 12/06/2016 FINAL  Final  Blood Culture (routine x 2)     Status: None (Preliminary result)   Collection Time: 12/05/16  3:46 PM  Result Value Ref Range Status   Specimen Description BLOOD RIGHT HAND  Final   Special Requests IN PEDIATRIC BOTTLE Blood Culture adequate volume  Final   Culture NO GROWTH 3 DAYS  Final   Report Status PENDING  Incomplete  Blood Culture (routine x 2)     Status: None (Preliminary result)   Collection Time: 12/05/16  3:50 PM  Result Value Ref Range Status   Specimen Description BLOOD LEFT HAND  Final   Special Requests IN PEDIATRIC BOTTLE Blood Culture adequate volume  Final   Culture NO GROWTH 3 DAYS  Final   Report Status PENDING  Incomplete    Radiology Reports Ct Head Wo Contrast  Result Date: 12/05/2016 CLINICAL DATA:  Altered mental status. Found on floor. History of dementia. EXAM: CT HEAD WITHOUT CONTRAST TECHNIQUE: Contiguous axial images were obtained from the base of the skull through the vertex without intravenous contrast. COMPARISON:  02/24/2016 FINDINGS: Brain: There is no evidence of acute infarct, intracranial hemorrhage, mass, midline shift, or extra-axial fluid collection. Mild cerebral atrophy is unchanged. Deep cerebral white matter hypodensities are similar to  the prior study and nonspecific but compatible with mild chronic small vessel ischemic disease. Vascular: Calcified atherosclerosis at the skullbase. No hyperdense vessel. Skull: No fracture or focal osseous lesion. Sinuses/Orbits: Visualized paranasal sinuses are clear. There is mild chronic air cell opacification at the left mastoid tip with osseous sclerosis. No acute orbital abnormality. Other: None. IMPRESSION: 1. No evidence of acute intracranial abnormality. 2. Mild chronic small vessel ischemic disease. Electronically Signed   By:  Sebastian Ache M.D.   On: 12/05/2016 13:27   Mr Brain Wo Contrast  Result Date: 12/05/2016 CLINICAL DATA:  Slurred speech EXAM: MRI HEAD WITHOUT CONTRAST TECHNIQUE: Multiplanar, multiecho pulse sequences of the brain and surrounding structures were obtained without intravenous contrast. COMPARISON:  Head CT 12/13/2016 FINDINGS: Brain: The midline structures are normal. There is no focal diffusion restriction to indicate acute infarct. There is multifocal hyperintense T2-weighted signal within the periventricular white matter, most often seen in the setting of chronic microvascular ischemia. Old lacunar infarct of the left basal ganglia. There are numerous chronic micro hemorrhages in a peripheral distribution. Brain volume is normal for age without age-advanced or lobar predominant atrophy. The dura is normal and there is no extra-axial collection. Vascular: Major intracranial arterial and venous sinus flow voids are preserved. Skull and upper cervical spine: The visualized skull base, calvarium, upper cervical spine and extracranial soft tissues are normal. Sinuses/Orbits: No fluid levels or advanced mucosal thickening. No mastoid effusion. Normal orbits. IMPRESSION: 1. No acute ischemia. 2. Numerous peripherally distributed chronic micro hemorrhages, most consistent with cerebral amyloid angiopathy. 3. Periventricular white matter hyperintensity, most commonly chronic  microvascular ischemia. Electronically Signed   By: Deatra Robinson M.D.   On: 12/05/2016 19:52   Dg Chest Port 1 View  Result Date: 12/05/2016 CLINICAL DATA:  Fall, left-sided weakness, initial encounter. EXAM: PORTABLE CHEST 1 VIEW COMPARISON:  02/24/2016. FINDINGS: Trachea is midline. Heart size stable. Thoracic aorta is calcified. Lungs are somewhat low in volume but clear. No pneumothorax. IMPRESSION: No acute findings. Electronically Signed   By: Leanna Battles M.D.   On: 12/05/2016 12:41    Lab Data:  CBC:  Recent Labs Lab 12/05/16 1124 12/05/16 1151 12/06/16 0513 12/07/16 0644  WBC 6.8  --  5.4 8.7  NEUTROABS 4.8  --   --   --   HGB 13.3 13.9 12.4 13.6  HCT 39.0 41.0 37.4 41.1  MCV 91.3  --  92.6 92.4  PLT 238  --  188 223   Basic Metabolic Panel:  Recent Labs Lab 12/05/16 1124 12/05/16 1151 12/06/16 0513 12/07/16 0644  NA 138 140 138 139  K 3.8 4.0 3.6 3.2*  CL 105 104 107 105  CO2 22  --  23 26  GLUCOSE 118* 120* 87 108*  BUN 15 20 9 8   CREATININE 1.26* 1.10* 0.91 0.99  CALCIUM 8.9  --  7.6* 8.5*   GFR: Estimated Creatinine Clearance: 27 mL/min (by C-G formula based on SCr of 0.99 mg/dL). Liver Function Tests:  Recent Labs Lab 12/05/16 1124  AST 33  ALT 13*  ALKPHOS 55  BILITOT 0.5  PROT 7.1  ALBUMIN 3.7   No results for input(s): LIPASE, AMYLASE in the last 168 hours. No results for input(s): AMMONIA in the last 168 hours. Coagulation Profile:  Recent Labs Lab 12/05/16 2020  INR 0.99   Cardiac Enzymes: No results for input(s): CKTOTAL, CKMB, CKMBINDEX, TROPONINI in the last 168 hours. BNP (last 3 results) No results for input(s): PROBNP in the last 8760 hours. HbA1C: No results for input(s): HGBA1C in the last 72 hours. CBG:  Recent Labs Lab 12/05/16 1220  GLUCAP 95   Lipid Profile: No results for input(s): CHOL, HDL, LDLCALC, TRIG, CHOLHDL, LDLDIRECT in the last 72 hours. Thyroid Function Tests:  Recent Labs  12/07/16 0644    TSH 2.147   Anemia Panel:  Recent Labs  12/07/16 0644  VITAMINB12 288  FOLATE 21.0   Urine analysis:  Component Value Date/Time   COLORURINE STRAW (A) 12/05/2016 1523   APPEARANCEUR CLEAR 12/05/2016 1523   LABSPEC 1.005 12/05/2016 1523   PHURINE 7.0 12/05/2016 1523   GLUCOSEU NEGATIVE 12/05/2016 1523   HGBUR SMALL (A) 12/05/2016 1523   BILIRUBINUR NEGATIVE 12/05/2016 1523   KETONESUR NEGATIVE 12/05/2016 1523   PROTEINUR NEGATIVE 12/05/2016 1523   UROBILINOGEN 0.2 09/22/2014 1445   NITRITE NEGATIVE 12/05/2016 1523   LEUKOCYTESUR TRACE (A) 12/05/2016 1523     Brittney Yang M.D. [facility] 12/08/2016, 2:17 PM  Pager: [phone] Between 7am to 7pm - call Pager - [phone]  After 7pm go to [email or web address] - password [email or web address]  Call night coverage person covering after 7pm

## 2016-12-09 DIAGNOSIS — G9340 Encephalopathy, unspecified: Secondary | ICD-10-CM

## 2016-12-09 LAB — HEPATIC FUNCTION PANEL
ALT: 10 U/L — ABNORMAL LOW (ref 14–54)
AST: 17 U/L (ref 15–41)
Albumin: 3 g/dL — ABNORMAL LOW (ref 3.5–5.0)
Alkaline Phosphatase: 44 U/L (ref 38–126)
BILIRUBIN DIRECT: 0.2 mg/dL (ref 0.1–0.5)
BILIRUBIN INDIRECT: 0.4 mg/dL (ref 0.3–0.9)
BILIRUBIN TOTAL: 0.6 mg/dL (ref 0.3–1.2)
Total Protein: 6.5 g/dL (ref 6.5–8.1)

## 2016-12-09 LAB — CBC WITH DIFFERENTIAL/PLATELET
Basophils Absolute: 0 10*3/uL (ref 0.0–0.1)
Basophils Relative: 0 %
EOS PCT: 0 %
Eosinophils Absolute: 0 10*3/uL (ref 0.0–0.7)
HCT: 39.6 % (ref 36.0–46.0)
Hemoglobin: 12.9 g/dL (ref 12.0–15.0)
LYMPHS ABS: 0.9 10*3/uL (ref 0.7–4.0)
LYMPHS PCT: 9 %
MCH: 30.8 pg (ref 26.0–34.0)
MCHC: 32.6 g/dL (ref 30.0–36.0)
MCV: 94.5 fL (ref 78.0–100.0)
MONO ABS: 0.5 10*3/uL (ref 0.1–1.0)
Monocytes Relative: 5 %
Neutro Abs: 8.9 10*3/uL — ABNORMAL HIGH (ref 1.7–7.7)
Neutrophils Relative %: 86 %
PLATELETS: 238 10*3/uL (ref 150–400)
RBC: 4.19 MIL/uL (ref 3.87–5.11)
RDW: 14.3 % (ref 11.5–15.5)
WBC: 10.3 10*3/uL (ref 4.0–10.5)

## 2016-12-09 LAB — BASIC METABOLIC PANEL
Anion gap: 10 (ref 5–15)
BUN: 18 mg/dL (ref 6–20)
CHLORIDE: 105 mmol/L (ref 101–111)
CO2: 24 mmol/L (ref 22–32)
Calcium: 8.6 mg/dL — ABNORMAL LOW (ref 8.9–10.3)
Creatinine, Ser: 1.12 mg/dL — ABNORMAL HIGH (ref 0.44–1.00)
GFR calc Af Amer: 50 mL/min — ABNORMAL LOW (ref >60)
GFR calc non Af Amer: 43 mL/min — ABNORMAL LOW (ref >60)
GLUCOSE: 133 mg/dL — AB (ref 65–99)
POTASSIUM: 3.9 mmol/L (ref 3.5–5.1)
Sodium: 139 mmol/L (ref 135–145)

## 2016-12-09 LAB — BLOOD GAS, ARTERIAL
ACID-BASE DEFICIT: 0.5 mmol/L (ref 0.0–2.0)
BICARBONATE: 23.7 mmol/L (ref 20.0–28.0)
DRAWN BY: 50522
O2 Content: 2 L/min
O2 Saturation: 97.6 %
PCO2 ART: 39.7 mmHg (ref 32.0–48.0)
PH ART: 7.394 (ref 7.350–7.450)
PO2 ART: 99 mmHg (ref 83.0–108.0)
Patient temperature: 98.6

## 2016-12-09 LAB — AMMONIA: Ammonia: 29 umol/L (ref 9–35)

## 2016-12-09 LAB — GLUCOSE, CAPILLARY: GLUCOSE-CAPILLARY: 153 mg/dL — AB (ref 65–99)

## 2016-12-09 MED ORDER — ACETAMINOPHEN 500 MG PO TABS: 500.0000 mg | ORAL_TABLET | Freq: Three times a day (TID) | ORAL | 0 refills | Status: DC | PRN

## 2016-12-09 MED ORDER — SODIUM CHLORIDE 0.9 % IV BOLUS (SEPSIS)
500.0000 mL | Freq: Once | INTRAVENOUS | Status: AC
  Administered 2016-12-09: 500 mL via INTRAVENOUS

## 2016-12-09 MED ORDER — CEPHALEXIN 500 MG PO CAPS
500.0000 mg | ORAL_CAPSULE | Freq: Two times a day (BID) | ORAL | Status: DC
Start: 2016-12-09 — End: 2017-01-12

## 2016-12-09 MED ORDER — SODIUM CHLORIDE 0.9 % IV SOLN
INTRAVENOUS | Status: DC
  Administered 2016-12-09: 09:00:00 via INTRAVENOUS

## 2016-12-09 NOTE — Progress Notes (Signed)
Patient is extremely drowsy & lethargic.  Have to sternal rub patient to get her to wake up & then falls right back to sleep.  Will not open her eyes.  RN contacted provider & received new orders   RN will continue to monitor patient

## 2016-12-09 NOTE — Progress Notes (Signed)
Clinical Social Worker facilitated patient discharge including contacting patient family and facility to confirm patient discharge plans.  Clinical information faxed to facility and family agreeable with plan.  CSW arranged [facility] .  RN to call [phone] and ask for RN Beth for report prior to discharge.  Clinical Social Worker will sign off for now as social work intervention is no longer needed. Please consult us again if new need arises.  Marrianne MoodAshley Arn Mcomber, MSW, [employer]CSWA [phone]

## 2016-12-09 NOTE — Progress Notes (Signed)
Report called to nurse Way[facility]Beth at [facility]. D/C summary faxed per SW. Discharge instruction given to family, deny questions/concerns at this time. Awaiting [facility] transportation at this time.

## 2016-12-09 NOTE — Progress Notes (Signed)
[facility]                                                                              Patient Demographics  Brittney Yang, is a 83 y.o. female, DOB - 1927-08-19, BJY:[id number]  Admit date - 12/05/2016   Admitting Physician Kathlen Mody, MD  Outpatient Primary MD for the patient is [name], MD  Outpatient specialists:   LOS - 3  days   Medical records reviewed and are as summarized below:    Chief Complaint  Patient presents with  . Fall  . Altered Mental Status       Brief summary   Patient is a 83 year old female with hypothyroidism, dementia was brought by family after a fall. Per daughter at the bedside, patient was found on the floor on the left side and couldn't get up. Patient has dementia and was not able to provide any history. Per daughter she was more confused than her baseline and the reported some slurring of speech. She also had infection of the left great toe and was taking Keflex for the last 6 days. In ED, she was found to be dehydrated, labs revealed a lactic acidosis. CT head showed no acute abnormality. Patient was admitted for further workup.   Assessment & Plan    Principal Problem:   Acute metabolic encephalopathyWith underlying history of advanced dementia - Likely worsened due to dehydration, Sundowning superimposed on dementia - CT head negative. Given report of slurred speech, MRI of the brain was done which showed no acute ischemia, numerous peripherally distributed chronic microhemorrhages most consistent with cerebral amyloid angiopathy, chronic microvascular ischemia - Continue Aricept  - TSH, folate, B12 within normal limits  - UA negative for UTI - Since yesterday evening, patient has been more sleepy and drowsy likely sundowning superimposed on dementia. CT head was repeated again in 6/23 which was negative. Labs normal, ammonia level 29, WBCs 10.3. Vital signs stable. Not on any sedating medications. No fevers -   placed on gentle hydration until taking oral - will get ABG to rule out hypercarbia  Active Problems:    Fall -PTOT evaluation recommended skilled nursing facility. Likely DC when patient is alert and awake     AKI (acute kidney injury) (HCC) - Resolved, creatinine 1.1 at the time of admission improved to 0.9    Dehydration with lactic acidosis - Lactic acid 4.02 at the time of admission, improved after IV fluid hydration,    Hypothyroidism - TSH 2.1, continue Synthroid    Code Status: Full CODE STATUS DVT Prophylaxis:  Lovenox  Family Communication: Discussed and updated all the imaging and labs data with the daughter.   Disposition Plan: Skilled nursing Saturday when patient is alert and awake  Time Spent in minutes  25 minutes  Procedures:  MRI brain  CT head repeated on 6/23  Consultants:   None   Antimicrobials:   None    Medications  Scheduled Meds: . aspirin  325 mg Oral QHS  . cephALEXin  500 mg Oral Q12H  . donepezil  10 mg Oral QHS  . enoxaparin (LOVENOX) injection  30 mg Subcutaneous  Q24H  . levothyroxine  75 mcg Oral QAC breakfast   Continuous Infusions: . sodium chloride 75 mL/hr at 12/09/16 0852   PRN Meds:.acetaminophen   Antibiotics   Anti-infectives    Start     Dose/Rate Route Frequency Ordered Stop   12/05/16 2200  cephALEXin (KEFLEX) capsule 500 mg     500 mg Oral Every 12 hours 12/05/16 1856          Subjective:   Amandalynn Pitz was seen and examined today. Patient is drowsy and sleeping, snoring. Barely wakes up to sternal lab and falls back to sleep. Vital signs stable. Daughter at the bedside   Objective:   Vitals:   12/09/16 0003 12/09/16 0220 12/09/16 0500 12/09/16 1003  BP:  (!) 119/46 (!) 144/66 (!) 172/69  Pulse: 72 73 71 85  Resp:  16 17 18   Temp:  99.5 F (37.5 C) 97.2 F (36.2 C) 98.1 F (36.7 C)  TempSrc:  Axillary Axillary Axillary  SpO2: 97% 96% 97% 100%  Weight:      Height:       No intake or  output data in the 24 hours ending 12/09/16 1149   Wt Readings from Last 3 Encounters:  12/05/16 47.1 kg (103 lb 14.4 oz)  06/19/16 58.8 kg (129 lb 9.6 oz)  02/13/16 59 kg (130 lb)     Exam  General: Sleeping and snoring, somnolent  Eyes: PERRLA, EOMI  HEENT:  normocephalic, atraumatic  Cardiovascular: S1 and S2 clear, RRR  Respiratory: Clear to auscultation bilaterally  Gastrointestinal: Soft, nontender, nondistended, normal bowel sounds  Ext: no pedal edema  Neuro: unable to assess  Musculoskeletal: No cyanosis, clubbing   Skin: No rashes  Psych: somnolent  Data Reviewed:  I have personally reviewed following labs and imaging studies  Micro Results Recent Results (from the past 240 hour(s))  Urine culture     Status: Abnormal   Collection Time: 12/05/16  3:23 PM  Result Value Ref Range Status   Specimen Description URINE, CLEAN CATCH  Final   Special Requests NONE  Final   Culture <10,000 COLONIES/mL INSIGNIFICANT GROWTH (A)  Final   Report Status 12/06/2016 FINAL  Final  Blood Culture (routine x 2)     Status: None (Preliminary result)   Collection Time: 12/05/16  3:46 PM  Result Value Ref Range Status   Specimen Description BLOOD RIGHT HAND  Final   Special Requests IN PEDIATRIC BOTTLE Blood Culture adequate volume  Final   Culture NO GROWTH 4 DAYS  Final   Report Status PENDING  Incomplete  Blood Culture (routine x 2)     Status: None (Preliminary result)   Collection Time: 12/05/16  3:50 PM  Result Value Ref Range Status   Specimen Description BLOOD LEFT HAND  Final   Special Requests IN PEDIATRIC BOTTLE Blood Culture adequate volume  Final   Culture NO GROWTH 4 DAYS  Final   Report Status PENDING  Incomplete    Radiology Reports Ct Head Wo Contrast  Result Date: 12/08/2016 CLINICAL DATA:  Acute encephalopathy.  History of dementia. EXAM: CT HEAD WITHOUT CONTRAST TECHNIQUE: Contiguous axial images were obtained from the base of the skull through  the vertex without intravenous contrast. COMPARISON:  CT and MR 12/05/2016. FINDINGS: Brain: No evidence for acute infarction, hemorrhage, mass lesion, hydrocephalus, or extra-axial fluid. Advanced atrophy. Moderate white matter disease. Vascular: No hyperdense vessel. Calcific atherosclerosis affecting the skull base internal carotid arteries. Skull: Normal. Negative for fracture or focal lesion.  Sinuses/Orbits: No acute finding.  BILATERAL cataract surgery. Other: Chronic sclerosis of the BILATERAL mastoid tips. IMPRESSION: Chronic changes as described, similar to recent MR and CT. No acute intracranial findings. Electronically Signed   By: Elsie Stain M.D.   On: 12/08/2016 21:15   Ct Head Wo Contrast  Result Date: 12/05/2016 CLINICAL DATA:  Altered mental status. Found on floor. History of dementia. EXAM: CT HEAD WITHOUT CONTRAST TECHNIQUE: Contiguous axial images were obtained from the base of the skull through the vertex without intravenous contrast. COMPARISON:  02/24/2016 FINDINGS: Brain: There is no evidence of acute infarct, intracranial hemorrhage, mass, midline shift, or extra-axial fluid collection. Mild cerebral atrophy is unchanged. Deep cerebral white matter hypodensities are similar to the prior study and nonspecific but compatible with mild chronic small vessel ischemic disease. Vascular: Calcified atherosclerosis at the skullbase. No hyperdense vessel. Skull: No fracture or focal osseous lesion. Sinuses/Orbits: Visualized paranasal sinuses are clear. There is mild chronic air cell opacification at the left mastoid tip with osseous sclerosis. No acute orbital abnormality. Other: None. IMPRESSION: 1. No evidence of acute intracranial abnormality. 2. Mild chronic small vessel ischemic disease. Electronically Signed   By: Sebastian Ache M.D.   On: 12/05/2016 13:27   Mr Brain Wo Contrast  Result Date: 12/05/2016 CLINICAL DATA:  Slurred speech EXAM: MRI HEAD WITHOUT CONTRAST TECHNIQUE:  Multiplanar, multiecho pulse sequences of the brain and surrounding structures were obtained without intravenous contrast. COMPARISON:  Head CT 12/13/2016 FINDINGS: Brain: The midline structures are normal. There is no focal diffusion restriction to indicate acute infarct. There is multifocal hyperintense T2-weighted signal within the periventricular white matter, most often seen in the setting of chronic microvascular ischemia. Old lacunar infarct of the left basal ganglia. There are numerous chronic micro hemorrhages in a peripheral distribution. Brain volume is normal for age without age-advanced or lobar predominant atrophy. The dura is normal and there is no extra-axial collection. Vascular: Major intracranial arterial and venous sinus flow voids are preserved. Skull and upper cervical spine: The visualized skull base, calvarium, upper cervical spine and extracranial soft tissues are normal. Sinuses/Orbits: No fluid levels or advanced mucosal thickening. No mastoid effusion. Normal orbits. IMPRESSION: 1. No acute ischemia. 2. Numerous peripherally distributed chronic micro hemorrhages, most consistent with cerebral amyloid angiopathy. 3. Periventricular white matter hyperintensity, most commonly chronic microvascular ischemia. Electronically Signed   By: Deatra Robinson M.D.   On: 12/05/2016 19:52   Dg Chest Port 1 View  Result Date: 12/05/2016 CLINICAL DATA:  Fall, left-sided weakness, initial encounter. EXAM: PORTABLE CHEST 1 VIEW COMPARISON:  02/24/2016. FINDINGS: Trachea is midline. Heart size stable. Thoracic aorta is calcified. Lungs are somewhat low in volume but clear. No pneumothorax. IMPRESSION: No acute findings. Electronically Signed   By: Leanna Battles M.D.   On: 12/05/2016 12:41    Lab Data:  CBC:  Recent Labs Lab 12/05/16 1124 12/05/16 1151 12/06/16 0513 12/07/16 0644 12/09/16 0346  WBC 6.8  --  5.4 8.7 10.3  NEUTROABS 4.8  --   --   --  8.9*  HGB 13.3 13.9 12.4 13.6 12.9    HCT 39.0 41.0 37.4 41.1 39.6  MCV 91.3  --  92.6 92.4 94.5  PLT 238  --  188 223 238   Basic Metabolic Panel:  Recent Labs Lab 12/05/16 1124 12/05/16 1151 12/06/16 0513 12/07/16 0644 12/09/16 0346  NA 138 140 138 139 139  K 3.8 4.0 3.6 3.2* 3.9  CL 105 104 107 105 105  CO2  22  --  23 26 24   GLUCOSE 118* 120* 87 108* 133*  BUN 15 20 9 8 18   CREATININE 1.26* 1.10* 0.91 0.99 1.12*  CALCIUM 8.9  --  7.6* 8.5* 8.6*   GFR: Estimated Creatinine Clearance: 23.9 mL/min (A) (by C-G formula based on SCr of 1.12 mg/dL (H)). Liver Function Tests:  Recent Labs Lab 12/05/16 1124 12/09/16 0346  AST 33 17  ALT 13* 10*  ALKPHOS 55 44  BILITOT 0.5 0.6  PROT 7.1 6.5  ALBUMIN 3.7 3.0*   No results for input(s): LIPASE, AMYLASE in the last 168 hours.  Recent Labs Lab 12/09/16 0346  AMMONIA 29   Coagulation Profile:  Recent Labs Lab 12/05/16 2020  INR 0.99   Cardiac Enzymes: No results for input(s): CKTOTAL, CKMB, CKMBINDEX, TROPONINI in the last 168 hours. BNP (last 3 results) No results for input(s): PROBNP in the last 8760 hours. HbA1C: No results for input(s): HGBA1C in the last 72 hours. CBG:  Recent Labs Lab 12/05/16 1220 12/09/16 0234  GLUCAP 95 153*   Lipid Profile: No results for input(s): CHOL, HDL, LDLCALC, TRIG, CHOLHDL, LDLDIRECT in the last 72 hours. Thyroid Function Tests:  Recent Labs  12/07/16 0644  TSH 2.147   Anemia Panel:  Recent Labs  12/07/16 0644  VITAMINB12 288  FOLATE 21.0   Urine analysis:    Component Value Date/Time   COLORURINE STRAW (A) 12/05/2016 1523   APPEARANCEUR CLEAR 12/05/2016 1523   LABSPEC 1.005 12/05/2016 1523   PHURINE 7.0 12/05/2016 1523   GLUCOSEU NEGATIVE 12/05/2016 1523   HGBUR SMALL (A) 12/05/2016 1523   BILIRUBINUR NEGATIVE 12/05/2016 1523   KETONESUR NEGATIVE 12/05/2016 1523   PROTEINUR NEGATIVE 12/05/2016 1523   UROBILINOGEN 0.2 09/22/2014 1445   NITRITE NEGATIVE 12/05/2016 1523   LEUKOCYTESUR  TRACE (A) 12/05/2016 1523     Ripudeep Rai M.D. [facility] 12/09/2016, 11:49 AM  Pager: [phone] Between 7am to 7pm - call Pager - [phone]  After 7pm go to [email or web address] - password [email or web address]  Call night coverage person covering after 7pm

## 2016-12-09 NOTE — Discharge Summary (Signed)
Physician Discharge Summary   Patient ID: Brittney Yang MRN: [id number] DOB/AGE: 81-May-1929 81 y.o.  Admit date: 12/05/2016 Discharge date: 12/09/2016  Primary Care Physician:  Barbie BannerWilson, Fred H, MD  Discharge Diagnoses:    . Fall . Dementia without behavioral disturbance . Acute metabolic encephalopathy- improved  . AKI (acute kidney injury) (HCC) . Dehydration . Hypothyroidism . Lactic acidosis   Consults:  None   Recommendations for Outpatient Follow-up:   1. Please repeat CBC/BMET at next visit    DIET: Dysphagia 2 SOFT diet with thin liquids     Allergies:   Allergies  Allergen Reactions  . Methimazole [Methimazole] Anaphylaxis  . Haloperidol Palpitations  . Penicillins Hives    Has patient had a PCN reaction causing immediate rash, facial/tongue/throat swelling, SOB or lightheadedness with hypotension: Yes Has patient had a PCN reaction causing severe rash involving mucus membranes or skin necrosis: No Has patient had a PCN reaction that required hospitalization No Has patient had a PCN reaction occurring within the last 10 years: No If all of the above answers are "NO", then may proceed with Cephalosporin use.      DISCHARGE MEDICATIONS: Current Discharge Medication List    CONTINUE these medications which have CHANGED   Details  acetaminophen (TYLENOL) 500 MG tablet Take 1 tablet (500 mg total) by mouth every 8 (eight) hours as needed for mild pain, fever or headache. Qty: 30 tablet, Refills: 0    cephALEXin (KEFLEX) 500 MG capsule Take 1 capsule (500 mg total) by mouth 2 (two) times daily. X 1 week      CONTINUE these medications which have NOT CHANGED   Details  aspirin 325 MG tablet Take 325 mg by mouth at bedtime.     cholecalciferol (VITAMIN D) 1000 units tablet Take 5,000 Units by mouth daily.    Cyanocobalamin (B-12) 2500 MCG TABS Take 2,500 mcg by mouth daily.    donepezil (ARICEPT) 10 MG tablet Take 10 mg by mouth at bedtime.      levothyroxine (SYNTHROID, LEVOTHROID) 75 MCG tablet Take 1 tablet (75 mcg total) by mouth daily. Qty: 30 tablet, Refills: 3      STOP taking these medications     LORazepam (ATIVAN) 0.5 MG tablet      risperiDONE (RISPERDAL) 0.5 MG tablet          Brief H and P: For complete details please refer to admission H and P, but in brief  Patient is a 83 year old female with hypothyroidism, dementia was brought by family after a fall. Per daughter at the bedside, patient was found on the floor on the left side and couldn't get up. Patient has dementia and was not able to provide any history. Per daughter she was more confused than her baseline and the reported some slurring of speech. She also had infection of the left great toe and was taking Keflex for the last 6 days. In ED, she was found to be dehydrated, labs revealed a lactic acidosis. CT head showed no acute abnormality. Patient was admitted for further workup.   Hospital Course:  Acute metabolic encephalopathyWith underlying history of advanced dementia - Likely worsened due to dehydration, Sundowning superimposed on dementia - CT head negative. Given report of slurred speech, MRI of the brain was done which showed no acute ischemia, numerous peripherally distributed chronic microhemorrhages most consistent with cerebral amyloid angiopathy, chronic microvascular ischemia - Continue Aricept  - TSH, folate, B12 within normal limits  - UA negative for UTI -  On 6/23, evening, patient became more sleepy and drowsy likely sundowning superimposed on dementia. CT head was repeated again in 6/23 which was negative. Labs normal, ammonia level 29, WBCs 10.3. Vital signs stable. Not on any sedating medications. No fevers - placed on gentle hydration until taking oral, now back on diet and tolerating - ABG showed no hypercarbia - Now improved, alert, awake, oriented back to baseline. Likely sundowning with dementia.      Fall -PTOT evaluation  recommended skilled nursing facility.       AKI (acute kidney injury) (HCC) - Resolved, creatinine 1.1 at the time of admission improved to 0.9    Dehydration with lactic acidosis - Lactic acid 4.02 at the time of admission, improved after IV fluid hydration,    Hypothyroidism - TSH 2.1, continue Synthroid   Infection the left great toe: - Continue keflex for a week  Day of Discharge BP (!) 161/75 (BP Location: Left Arm)   Pulse 70   Temp 98.1 F (36.7 C) (Axillary)   Resp 18   Ht 4\' 9"  (1.448 m)   Wt 47.1 kg (103 lb 14.4 oz)   SpO2 99%   BMI 22.48 kg/m   Physical Exam: General: Alert and awake oriented, not in any acute distress. HEENT: anicteric sclera, pupils reactive to light and accommodation CVS: S1-S2 clear no murmur rubs or gallops Chest: clear to auscultation bilaterally, no wheezing rales or rhonchi Abdomen: soft nontender, nondistended, normal bowel sounds Extremities: no cyanosis, clubbing or edema noted bilaterally Neuro: Cranial nerves II-XII intact, no focal neurological deficits   The results of significant diagnostics from this hospitalization (including imaging, microbiology, ancillary and laboratory) are listed below for reference.    LAB RESULTS: Basic Metabolic Panel:  Recent Labs Lab 12/07/16 0644 12/09/16 0346  NA 139 139  K 3.2* 3.9  CL 105 105  CO2 26 24  GLUCOSE 108* 133*  BUN 8 18  CREATININE 0.99 1.12*  CALCIUM 8.5* 8.6*   Liver Function Tests:  Recent Labs Lab 12/05/16 1124 12/09/16 0346  AST 33 17  ALT 13* 10*  ALKPHOS 55 44  BILITOT 0.5 0.6  PROT 7.1 6.5  ALBUMIN 3.7 3.0*   No results for input(s): LIPASE, AMYLASE in the last 168 hours.  Recent Labs Lab 12/09/16 0346  AMMONIA 29   CBC:  Recent Labs Lab 12/07/16 0644 12/09/16 0346  WBC 8.7 10.3  NEUTROABS  --  8.9*  HGB 13.6 12.9  HCT 41.1 39.6  MCV 92.4 94.5  PLT 223 238   Cardiac Enzymes: No results for input(s): CKTOTAL, CKMB, CKMBINDEX,  TROPONINI in the last 168 hours. BNP: Invalid input(s): POCBNP CBG:  Recent Labs Lab 12/05/16 1220 12/09/16 0234  GLUCAP 95 153*    Significant Diagnostic Studies:  Ct Head Wo Contrast  Result Date: 12/05/2016 CLINICAL DATA:  Altered mental status. Found on floor. History of dementia. EXAM: CT HEAD WITHOUT CONTRAST TECHNIQUE: Contiguous axial images were obtained from the base of the skull through the vertex without intravenous contrast. COMPARISON:  02/24/2016 FINDINGS: Brain: There is no evidence of acute infarct, intracranial hemorrhage, mass, midline shift, or extra-axial fluid collection. Mild cerebral atrophy is unchanged. Deep cerebral white matter hypodensities are similar to the prior study and nonspecific but compatible with mild chronic small vessel ischemic disease. Vascular: Calcified atherosclerosis at the skullbase. No hyperdense vessel. Skull: No fracture or focal osseous lesion. Sinuses/Orbits: Visualized paranasal sinuses are clear. There is mild chronic air cell opacification at the left mastoid  tip with osseous sclerosis. No acute orbital abnormality. Other: None. IMPRESSION: 1. No evidence of acute intracranial abnormality. 2. Mild chronic small vessel ischemic disease. Electronically Signed   By: Sebastian Ache M.D.   On: 12/05/2016 13:27   Mr Brain Wo Contrast  Result Date: 12/05/2016 CLINICAL DATA:  Slurred speech EXAM: MRI HEAD WITHOUT CONTRAST TECHNIQUE: Multiplanar, multiecho pulse sequences of the brain and surrounding structures were obtained without intravenous contrast. COMPARISON:  Head CT 12/13/2016 FINDINGS: Brain: The midline structures are normal. There is no focal diffusion restriction to indicate acute infarct. There is multifocal hyperintense T2-weighted signal within the periventricular white matter, most often seen in the setting of chronic microvascular ischemia. Old lacunar infarct of the left basal ganglia. There are numerous chronic micro hemorrhages in a  peripheral distribution. Brain volume is normal for age without age-advanced or lobar predominant atrophy. The dura is normal and there is no extra-axial collection. Vascular: Major intracranial arterial and venous sinus flow voids are preserved. Skull and upper cervical spine: The visualized skull base, calvarium, upper cervical spine and extracranial soft tissues are normal. Sinuses/Orbits: No fluid levels or advanced mucosal thickening. No mastoid effusion. Normal orbits. IMPRESSION: 1. No acute ischemia. 2. Numerous peripherally distributed chronic micro hemorrhages, most consistent with cerebral amyloid angiopathy. 3. Periventricular white matter hyperintensity, most commonly chronic microvascular ischemia. Electronically Signed   By: Deatra Robinson M.D.   On: 12/05/2016 19:52   Dg Chest Port 1 View  Result Date: 12/05/2016 CLINICAL DATA:  Fall, left-sided weakness, initial encounter. EXAM: PORTABLE CHEST 1 VIEW COMPARISON:  02/24/2016. FINDINGS: Trachea is midline. Heart size stable. Thoracic aorta is calcified. Lungs are somewhat low in volume but clear. No pneumothorax. IMPRESSION: No acute findings. Electronically Signed   By: Leanna Battles M.D.   On: 12/05/2016 12:41    2D ECHO:   Disposition and Follow-up: Discharge Instructions    Diet - low sodium heart healthy    Complete by:  As directed    Increase activity slowly    Complete by:  As directed        DISPOSITION: SNF   DISCHARGE FOLLOW-UP  Contact information for follow-up providers    [name], MD. Schedule an appointment as soon as possible for a visit in 2 week(s).   Specialty:  Family Medicine Contact information: [address] [place] [address]            Contact information for after-discharge care    Destination    [facility] Follow up.   Specialty:  Skilled Nursing Facility Contact information: [address] [place] [address] [phone]                    Time spent on Discharge:   Signed:   Thad Ranger M.D. [facility] 12/09/2016, 4:01 PM Pager: [phone]

## 2016-12-10 LAB — CULTURE, BLOOD (ROUTINE X 2)
CULTURE: NO GROWTH
Culture: NO GROWTH
SPECIAL REQUESTS: ADEQUATE
Special Requests: ADEQUATE

## 2016-12-17 ENCOUNTER — Ambulatory Visit: Payer: Medicare Other | Admitting: Endocrinology

## 2017-01-10 ENCOUNTER — Encounter (HOSPITAL_COMMUNITY): Payer: Self-pay | Admitting: Emergency Medicine

## 2017-01-10 ENCOUNTER — Emergency Department (HOSPITAL_COMMUNITY): Payer: Medicare Other

## 2017-01-10 ENCOUNTER — Inpatient Hospital Stay (HOSPITAL_COMMUNITY)
Admission: EM | Admit: 2017-01-10 | Discharge: 2017-01-12 | DRG: 194 | Disposition: A | Discharge status: Home Health | Payer: Medicare Other | Attending: Family Medicine | Admitting: Family Medicine

## 2017-01-10 DIAGNOSIS — E872 Acidosis, unspecified: Secondary | ICD-10-CM

## 2017-01-10 DIAGNOSIS — E079 Disorder of thyroid, unspecified: Secondary | ICD-10-CM | POA: Diagnosis present

## 2017-01-10 DIAGNOSIS — E871 Hypo-osmolality and hyponatremia: Secondary | ICD-10-CM | POA: Diagnosis present

## 2017-01-10 DIAGNOSIS — Z880 Allergy status to penicillin: Secondary | ICD-10-CM

## 2017-01-10 DIAGNOSIS — Y95 Nosocomial condition: Secondary | ICD-10-CM | POA: Diagnosis present

## 2017-01-10 DIAGNOSIS — F0390 Unspecified dementia without behavioral disturbance: Secondary | ICD-10-CM | POA: Diagnosis present

## 2017-01-10 DIAGNOSIS — J189 Pneumonia, unspecified organism: Secondary | ICD-10-CM | POA: Diagnosis present

## 2017-01-10 DIAGNOSIS — J13 Pneumonia due to Streptococcus pneumoniae: Secondary | ICD-10-CM | POA: Diagnosis not present

## 2017-01-10 DIAGNOSIS — R410 Disorientation, unspecified: Secondary | ICD-10-CM | POA: Diagnosis not present

## 2017-01-10 DIAGNOSIS — E861 Hypovolemia: Secondary | ICD-10-CM | POA: Diagnosis present

## 2017-01-10 DIAGNOSIS — Z8673 Personal history of transient ischemic attack (TIA), and cerebral infarction without residual deficits: Secondary | ICD-10-CM

## 2017-01-10 DIAGNOSIS — Z79899 Other long term (current) drug therapy: Secondary | ICD-10-CM

## 2017-01-10 DIAGNOSIS — Z888 Allergy status to other drugs, medicaments and biological substances status: Secondary | ICD-10-CM

## 2017-01-10 DIAGNOSIS — G20 Parkinson's disease: Secondary | ICD-10-CM | POA: Diagnosis present

## 2017-01-10 DIAGNOSIS — Z7982 Long term (current) use of aspirin: Secondary | ICD-10-CM

## 2017-01-10 DIAGNOSIS — R05 Cough: Secondary | ICD-10-CM | POA: Diagnosis not present

## 2017-01-10 DIAGNOSIS — Z8249 Family history of ischemic heart disease and other diseases of the circulatory system: Secondary | ICD-10-CM

## 2017-01-10 LAB — CBC WITH DIFFERENTIAL/PLATELET
Basophils Absolute: 0 10*3/uL (ref 0.0–0.1)
Basophils Relative: 0 %
EOS ABS: 0 10*3/uL (ref 0.0–0.7)
Eosinophils Relative: 0 %
HCT: 35.6 % — ABNORMAL LOW (ref 36.0–46.0)
HEMOGLOBIN: 12.6 g/dL (ref 12.0–15.0)
LYMPHS ABS: 1.1 10*3/uL (ref 0.7–4.0)
Lymphocytes Relative: 9 %
MCH: 31.7 pg (ref 26.0–34.0)
MCHC: 35.4 g/dL (ref 30.0–36.0)
MCV: 89.7 fL (ref 78.0–100.0)
MONOS PCT: 9 %
Monocytes Absolute: 1.2 10*3/uL — ABNORMAL HIGH (ref 0.1–1.0)
Neutro Abs: 10.3 10*3/uL — ABNORMAL HIGH (ref 1.7–7.7)
Neutrophils Relative %: 82 %
Platelets: 255 10*3/uL (ref 150–400)
RBC: 3.97 MIL/uL (ref 3.87–5.11)
RDW: 13 % (ref 11.5–15.5)
WBC: 12.7 10*3/uL — ABNORMAL HIGH (ref 4.0–10.5)

## 2017-01-10 LAB — COMPREHENSIVE METABOLIC PANEL
ALT: 9 U/L — AB (ref 14–54)
ANION GAP: 9 (ref 5–15)
AST: 26 U/L (ref 15–41)
Albumin: 2.9 g/dL — ABNORMAL LOW (ref 3.5–5.0)
Alkaline Phosphatase: 40 U/L (ref 38–126)
BUN: 20 mg/dL (ref 6–20)
CALCIUM: 7.2 mg/dL — AB (ref 8.9–10.3)
CHLORIDE: 105 mmol/L (ref 101–111)
CO2: 19 mmol/L — AB (ref 22–32)
CREATININE: 0.95 mg/dL (ref 0.44–1.00)
GFR, EST NON AFRICAN AMERICAN: 53 mL/min — AB (ref >60)
Glucose, Bld: 124 mg/dL — ABNORMAL HIGH (ref 65–99)
Potassium: 4.4 mmol/L (ref 3.5–5.1)
SODIUM: 133 mmol/L — AB (ref 135–145)
Total Bilirubin: 1 mg/dL (ref 0.3–1.2)
Total Protein: 6 g/dL — ABNORMAL LOW (ref 6.5–8.1)

## 2017-01-10 LAB — URINALYSIS, ROUTINE W REFLEX MICROSCOPIC
Bacteria, UA: NONE SEEN
Bilirubin Urine: NEGATIVE
GLUCOSE, UA: NEGATIVE mg/dL
Hgb urine dipstick: NEGATIVE
Ketones, ur: 20 mg/dL — AB
Leukocytes, UA: NEGATIVE
Nitrite: NEGATIVE
PROTEIN: 100 mg/dL — AB
Specific Gravity, Urine: 1.02 (ref 1.005–1.030)
pH: 5 (ref 5.0–8.0)

## 2017-01-10 LAB — I-STAT CG4 LACTIC ACID, ED
LACTIC ACID, VENOUS: 1.22 mmol/L (ref 0.5–1.9)
Lactic Acid, Venous: 1.33 mmol/L (ref 0.5–1.9)

## 2017-01-10 MED ORDER — OLANZAPINE 2.5 MG PO TABS
2.5000 mg | ORAL_TABLET | Freq: Every day | ORAL | Status: DC
  Administered 2017-01-10 – 2017-01-11 (×2): 2.5 mg via ORAL
  Filled 2017-01-10 (×2): qty 1

## 2017-01-10 MED ORDER — VITAMIN D3 25 MCG (1000 UNIT) PO TABS
5000.0000 [IU] | ORAL_TABLET | Freq: Every day | ORAL | Status: DC
  Administered 2017-01-12: 5000 [IU] via ORAL
  Filled 2017-01-10: qty 5

## 2017-01-10 MED ORDER — ASPIRIN 325 MG PO TABS
325.0000 mg | ORAL_TABLET | Freq: Every day | ORAL | Status: DC
  Administered 2017-01-10 – 2017-01-11 (×2): 325 mg via ORAL
  Filled 2017-01-10 (×2): qty 1

## 2017-01-10 MED ORDER — DEXTROSE 5 % IV SOLN
2.0000 g | Freq: Once | INTRAVENOUS | Status: AC
  Administered 2017-01-10: 2 g via INTRAVENOUS
  Filled 2017-01-10: qty 2

## 2017-01-10 MED ORDER — LORAZEPAM 0.5 MG PO TABS
0.5000 mg | ORAL_TABLET | Freq: Four times a day (QID) | ORAL | Status: DC | PRN
  Administered 2017-01-10 – 2017-01-12 (×3): 0.5 mg via ORAL
  Filled 2017-01-10 (×3): qty 1

## 2017-01-10 MED ORDER — SODIUM CHLORIDE 0.9 % IV BOLUS (SEPSIS)
1000.0000 mL | Freq: Once | INTRAVENOUS | Status: AC
  Administered 2017-01-10: 1000 mL via INTRAVENOUS

## 2017-01-10 MED ORDER — VANCOMYCIN HCL IN DEXTROSE 1-5 GM/200ML-% IV SOLN
1000.0000 mg | Freq: Once | INTRAVENOUS | Status: AC
  Administered 2017-01-10: 1000 mg via INTRAVENOUS
  Filled 2017-01-10: qty 200

## 2017-01-10 MED ORDER — VANCOMYCIN HCL 500 MG IV SOLR
500.0000 mg | INTRAVENOUS | Status: DC
  Administered 2017-01-11: 500 mg via INTRAVENOUS
  Filled 2017-01-10: qty 500

## 2017-01-10 MED ORDER — ALBUTEROL SULFATE (2.5 MG/3ML) 0.083% IN NEBU: 2.5000 mg | INHALATION_SOLUTION | Freq: Four times a day (QID) | RESPIRATORY_TRACT | Status: DC | PRN

## 2017-01-10 MED ORDER — ACETAMINOPHEN 650 MG RE SUPP
650.0000 mg | Freq: Once | RECTAL | Status: AC
  Administered 2017-01-10: 650 mg via RECTAL
  Filled 2017-01-10: qty 1

## 2017-01-10 MED ORDER — ENOXAPARIN SODIUM 30 MG/0.3ML ~~LOC~~ SOLN
30.0000 mg | SUBCUTANEOUS | Status: DC
  Administered 2017-01-10 – 2017-01-11 (×2): 30 mg via SUBCUTANEOUS
  Filled 2017-01-10 (×2): qty 0.3

## 2017-01-10 MED ORDER — LEVOTHYROXINE SODIUM 75 MCG PO TABS
75.0000 ug | ORAL_TABLET | Freq: Every day | ORAL | Status: DC
  Administered 2017-01-11 – 2017-01-12 (×2): 75 ug via ORAL
  Filled 2017-01-10 (×2): qty 1

## 2017-01-10 MED ORDER — ACETAMINOPHEN 500 MG PO TABS: 500.0000 mg | ORAL_TABLET | Freq: Three times a day (TID) | ORAL | Status: DC | PRN

## 2017-01-10 MED ORDER — DEXTROSE 5 % IV SOLN
1.0000 g | INTRAVENOUS | Status: DC
  Administered 2017-01-10 – 2017-01-11 (×2): 1 g via INTRAVENOUS
  Filled 2017-01-10 (×2): qty 1

## 2017-01-10 MED ORDER — ACETAMINOPHEN 325 MG PO TABS: 650.0000 mg | ORAL_TABLET | Freq: Once | ORAL | Status: DC

## 2017-01-10 MED ORDER — VITAMIN B-12 1000 MCG PO TABS
2500.0000 ug | ORAL_TABLET | Freq: Every day | ORAL | Status: DC
  Administered 2017-01-12: 11:00:00 via ORAL
  Filled 2017-01-10: qty 3

## 2017-01-10 MED ORDER — SODIUM CHLORIDE 0.9 % IV BOLUS (SEPSIS)
500.0000 mL | Freq: Once | INTRAVENOUS | Status: AC
  Administered 2017-01-10: 500 mL via INTRAVENOUS

## 2017-01-10 MED ORDER — B-12 2500 MCG PO TABS: 2500.0000 ug | ORAL_TABLET | Freq: Every day | ORAL | Status: DC

## 2017-01-10 MED ORDER — DONEPEZIL HCL 5 MG PO TABS
10.0000 mg | ORAL_TABLET | Freq: Every day | ORAL | Status: DC
  Administered 2017-01-10 – 2017-01-11 (×2): 10 mg via ORAL
  Filled 2017-01-10 (×2): qty 2

## 2017-01-10 NOTE — Progress Notes (Addendum)
Pharmacy Antibiotic Note  Hyacinth MeekerVirginia P Monia PouchRiggs is a 83 y.o. female admitted on 01/10/2017 with pneumonia.  Pharmacy has been consulted for vancomycin/cefepime dosing.  Pt with  hx of dementia, thyroid disease, Parkinson disease, brought here via EMS from doctors office for concerns of pneumonia.  History is limited due to patient's dementia.     Plan: Vancomycin 1000 mg x1, then vancomycin 500 mg IV q24h.  Cefepime 1 gr IV q24h Monitor clinical course, renal function, cultures as available    Weight: 110 lb (49.9 kg)  Temp (24hrs), Avg:103.3 F (39.6 C), Min:103.3 F (39.6 C), Max:103.3 F (39.6 C)   Recent Labs Lab 01/10/17 1624 01/10/17 1632 01/10/17 1717  WBC 12.7*  --   --   CREATININE  --   --  0.95  LATICACIDVEN  --  1.33  --     Estimated Creatinine Clearance: 28.9 mL/min (by C-G formula based on SCr of 0.95 mg/dL).    Allergies  Allergen Reactions  . Methimazole [Methimazole] Anaphylaxis  . Haloperidol Palpitations  . Penicillins Hives    Has patient had a PCN reaction causing immediate rash, facial/tongue/throat swelling, SOB or lightheadedness with hypotension: Yes Has patient had a PCN reaction causing severe rash involving mucus membranes or skin necrosis: No Has patient had a PCN reaction that required hospitalization No Has patient had a PCN reaction occurring within the last 10 years: No If all of the above answers are "NO", then may proceed with Cephalosporin use.     Antimicrobials this admission: Aztreonam x1  7/26 vancomycin 7/26 >>  Cefepime 7/26 >>  Dose adjustments this admission: ----  Microbiology results: 7/26 BCx:   Thank you for allowing pharmacy to be a part of this patient's care.  Adalberto ColeNikola Mckenna Boruff, PharmD, BCPS Pager [phone] 01/10/2017 6:34 PM

## 2017-01-10 NOTE — ED Provider Notes (Signed)
[facility]-EMERGENCY DEPT Provider Note   CSN: [id number] Arrival date & time: 01/10/17  1602     History   Chief Complaint Chief Complaint  Patient presents with  . Fever  . Cough    HPI Brittney Yang is a 83 y.o. female.  HPI   83 year old female with hx of dementia, thyroid disease, Parkinson disease, brought here via EMS from doctors office for concerns of pneumonia.  History is limited due to patient's dementia. History obtained through nursing and through EMS. Patient has been in a rehabilitation facility for the past month due to a fall. She was recently sent home to her family today. She was noted to have cough and fever by home health nurse today and patient was brought to her PCP for further evaluation. She was sent here for concern of pneumonia when she was found to be febrile. Level V caveats for dementia.   Family members arrived and able to contribute to history. Pt has a cough x 2 days, appears more confused than baseline for the same duration.  Has an elevated temp at 100 while at the facility last night but it was not addressed.  Pt subsequently d/c today to home.     Past Medical History:  Diagnosis Date  . Dementia   . Thyroid disease   . Tremor     Patient Active Problem List   Diagnosis Date Noted  . Acute metabolic encephalopathy 12/06/2016  . AKI (acute kidney injury) (HCC) 12/06/2016  . Dehydration 12/06/2016  . Hypothyroidism 12/06/2016  . Lactic acidosis 12/06/2016  . Fall 12/05/2016  . Hypothyroidism, postradioiodine therapy 07/05/2014  . Hypotension 05/02/2014  . Hypothermia 05/02/2014  . 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 Dementia without behavioral disturbance 01/01/2014  . Postsurgical hypothyroidism 03/31/2013    Past Surgical History:  Procedure Laterality Date  . THYROID SURGERY  1960    OB History    No data available       Home Medications    Prior to Admission medications   Medication  Sig Start Date End Date Taking? Authorizing Provider  acetaminophen (TYLENOL) 500 MG tablet Take 1 tablet (500 mg total) by mouth every 8 (eight) hours as needed for mild pain, fever or headache. 12/09/16   Rai, Delene Ruffiniipudeep K, MD  aspirin 325 MG tablet Take 325 mg by mouth at bedtime.     [provider]  cephALEXin (KEFLEX) 500 MG capsule Take 1 capsule (500 mg total) by mouth 2 (two) times daily. X 1 week 12/09/16   Rai, Delene Ruffiniipudeep K, MD  cholecalciferol (VITAMIN D) 1000 units tablet Take 5,000 Units by mouth daily.    [provider]  Cyanocobalamin (B-12) 2500 MCG TABS Take 2,500 mcg by mouth daily.    [provider]  donepezil (ARICEPT) 10 MG tablet Take 10 mg by mouth at bedtime.     [provider]  levothyroxine (SYNTHROID, LEVOTHROID) 75 MCG tablet Take 1 tablet (75 mcg total) by mouth daily. 04/26/16   Reather LittlerKumar, Ajay, MD    Family History Family History  Problem Relation Age of Onset  . Hypertension Mother   . Hypertension Father     Social History Social History  Substance Use Topics  . Smoking status: Never Smoker  . Smokeless tobacco: Never Used  . Alcohol use No     Allergies   Methimazole [methimazole]; Haloperidol; and Penicillins   Review of Systems Review of Systems  Unable to perform  ROS: Dementia     Physical Exam Updated Vital Signs BP (!) 162/90 (BP Location: Right Arm)   Pulse (!) 114   Temp (!) 103.3 F (39.6 C) (Rectal)   Resp 20   Wt 49.9 kg (110 lb)   SpO2 93%   BMI 23.80 kg/m   Physical Exam  Constitutional: She appears well-developed and well-nourished. No distress.  elderly female laying in bed in no acute discomfort and nontoxic in appearance  HENT:  Head: Atraumatic.  Eyes: Conjunctivae are normal.  Neck: Neck supple.  Cardiovascular:  Tachycardia without murmur, rubs or gallops  Pulmonary/Chest:  Coarse lung sounds with poor effort  Abdominal: Soft. There is no tenderness.  Neurological: She is  alert. GCS eye subscore is 3. GCS verbal subscore is 3. GCS motor subscore is 5.  Skin: No rash noted.  Psychiatric: She has a normal mood and affect.  Nursing note and vitals reviewed.    ED Treatments / Results  Labs (all labs ordered are listed, but only abnormal results are displayed) Labs Reviewed  CBC WITH DIFFERENTIAL/PLATELET - Abnormal; Notable for the following:       Result Value   WBC 12.7 (*)    HCT 35.6 (*)    Neutro Abs 10.3 (*)    Monocytes Absolute 1.2 (*)    All other components within normal limits  URINALYSIS, ROUTINE W REFLEX MICROSCOPIC - Abnormal; Notable for the following:    Ketones, ur 20 (*)    Protein, ur 100 (*)    Squamous Epithelial / LPF 0-5 (*)    All other components within normal limits  COMPREHENSIVE METABOLIC PANEL - Abnormal; Notable for the following:    Sodium 133 (*)    CO2 19 (*)    Glucose, Bld 124 (*)    Calcium 7.2 (*)    Total Protein 6.0 (*)    Albumin 2.9 (*)    ALT 9 (*)    GFR calc non Af Amer 53 (*)    All other components within normal limits  CULTURE, BLOOD (ROUTINE X 2)  CULTURE, BLOOD (ROUTINE X 2)  I-STAT CG4 LACTIC ACID, ED    EKG  EKG Interpretation  Date/Time:  Thursday January 10 2017 16:13:46 EDT Ventricular Rate:  115 PR Interval:    QRS Duration: 74 QT Interval:  309 QTC Calculation: 428 R Axis:   -18 Text Interpretation:  Sinus tachycardia Borderline left axis deviation SINCE LAST TRACING HEART RATE HAS INCREASED Confirmed by Rolan BuccoBelfi, Melanie [phone]([id number]) on 01/10/2017 4:35:06 PM       Radiology Dg Chest 2 View  Result Date: 01/10/2017 CLINICAL DATA:  Fever, cough, dementia EXAM: CHEST  2 VIEW COMPARISON:  Chest x-ray of 12/05/2016 FINDINGS: There is patchy parenchymal opacity in the right mid lung suspicious for a focus of pneumonia. Otherwise the lungs are clear. No pleural effusion is seen. Mediastinal and hilar contours are unremarkable. The heart is within normal limits in size. No bony abnormality is  seen. IMPRESSION: Patchy opacity in the right mid lung suspicious for a small focus of pneumonia. Consider followup chest x-ray. Electronically Signed   By: Dwyane DeePaul  Barry M.D.   On: 01/10/2017 17:15    Procedures Procedures (including critical care time)  Medications Ordered in ED Medications  vancomycin (VANCOCIN) 500 mg in sodium chloride 0.9 % 100 mL IVPB (not administered)  sodium chloride 0.9 % bolus 1,000 mL (0 mLs Intravenous Stopped 01/10/17 1728)    And  sodium chloride 0.9 % bolus 500  mL (0 mLs Intravenous Stopped 01/10/17 1736)  aztreonam (AZACTAM) 2 g in dextrose 5 % 50 mL IVPB (0 g Intravenous Stopped 01/10/17 1728)  vancomycin (VANCOCIN) IVPB 1000 mg/200 mL premix (1,000 mg Intravenous New Bag/Given 01/10/17 1729)  acetaminophen (TYLENOL) suppository 650 mg (650 mg Rectal Given 01/10/17 1637)     Initial Impression / Assessment and Plan / ED Course  I have reviewed the triage vital signs and the nursing notes.  Pertinent labs & imaging results that were available during my care of the patient were reviewed by me and considered in my medical decision making (see chart for details).     BP (!) 168/70 (BP Location: Right Arm)   Pulse (!) 111   Temp (!) 103.3 F (39.6 C) (Rectal)   Resp 20   Wt 49.9 kg (110 lb)   SpO2 94%   BMI 23.80 kg/m    Final Clinical Impressions(s) / ED Diagnoses   Final diagnoses:  HCAP (healthcare-associated pneumonia)  Delirium    New Prescriptions New Prescriptions   No medications on file   4:41 PM Elderly female here with fever, cough, tachycardia, tachypneic. Symptom concerning for pneumonia, due to recent hospitalization, patient will be treated for suspect hospital-acquired pneumonia with aztreonam and vancomycin. Code sepsis initiated. Fluid resuscitation at 44ml/kg started.  Tylenol given for fever.  Care discussed with DR. Effie Shy.   5:31 PM Bicarbonate is 19, patient is tachypneic with respiratory 31 but no hypoxia as O2 has been  between 92-94% on room air. However, oxygen supplementation started. Her urine shows no signs of urinary tract infection, normal lactic acid. Mild elevated white count of 12.7.    Appreciate consultation from [facility] Dr. Ninfa Linden who agrees to see pt in the ER and will admit for further management of her HCAP.  Pt doe shave evidence of sepsis, but without severe sepsis.    CRITICAL CARE Performed by: Fayrene Helper Total critical care time: 30 minutes Critical care time was exclusive of separately billable procedures and treating other patients. Critical care was necessary to treat or prevent imminent or life-threatening deterioration. Critical care was time spent personally by me on the following activities: development of treatment plan with patient and/or surrogate as well as nursing, discussions with consultants, evaluation of patient's response to treatment, examination of patient, obtaining history from patient or surrogate, ordering and performing treatments and interventions, ordering and review of laboratory studies, ordering and review of radiographic studies, pulse oximetry and re-evaluation of patient's condition.    Fayrene Helper, PA-C 01/10/17 1836    Fayrene Helper, PA-C 01/10/17 [facility]    Mancel Bale, MD 01/12/17 [phone]

## 2017-01-10 NOTE — ED Notes (Signed)
Patient transported to X-ray 

## 2017-01-10 NOTE — ED Notes (Signed)
Bed: WA17 Expected date:  Expected time:  Means of arrival:  Comments: EMS dementia, cough

## 2017-01-10 NOTE — H&P (Signed)
History and Physical  [place] Cam[place] ZOX:[id number] DOB: 04/27/1928 DOA: 01/10/2017  PCP:  Barbie BannerWilson, Fred H, MD   Chief Complaint:  Cough  History of Present Illness:  Pt is a 83 yo female with hx of advanced dementia and recent admission for TIA who was sent to rehab and spent there the last month before coming back home today with home health nurse checking on her today. Home health nurse noticed mild crackles in her lungs and pt was wheezing mildly so she suggested they bring pt to the ED. Family said pt has been having cough, mild wheezing and fever for a few days. Pt has been so weak and tired recently and family needed 3 people to help move her to the car which is not her baseline state. She did not complain of pain. No N/V/D/C/abd pain/dysuria. Pt is poor historian due to advanced dementia and kept yelling asking for food which is her baseline per family.   Review of Systems:  CONSTITUTIONAL:     No night sweats.  +fatigue.  +fever. No chills. Eyes:                            No visual changes.  No eye pain.  No eye discharge.   ENT:                              No epistaxis.  No sinus pain.  No sore throat.   No congestion. RESPIRATORY:           +cough.  +wheeze.  No hemoptysis.  No dyspnea CARDIOVASCULAR   :  No chest pains.  No palpitations. GASTROINTESTINAL:  No abdominal pain.  No nausea. No vomiting.  No diarrhea. No      constipation.  No hematemesis.  No hematochezia.  No melena. GENITOURINARY:      No urgency.  No frequency.  No dysuria.  No hematuria.  No    obstructive symptoms.  No discharge.  No pain.   MUSCULOSKELETAL:  No musculoskeletal pain.  No joint swelling.  No arthritis. NEUROLOGICAL:        No confusion.  No weakness. No headache. No seizure. PSYCHIATRIC:             No depression. No anxiety. No suicidal ideation. SKIN:                             No rashes.  No lesions.  No wounds. ENDOCRINE:                No weight loss.  No polydipsia.  No  polyuria.  No polyphagia. HEMATOLOGIC:           No purpura.  No petechiae.  No bleeding.  ALLERGIC                 : No pruritus.  No angioedema Other:  Past Medical and Surgical History:   Past Medical History:  Diagnosis Date  . Dementia   . Thyroid disease   . Tremor    Past Surgical History:  Procedure Laterality Date  . THYROID SURGERY  1960    Social History:   reports that she has never smoked. She has never used smokeless tobacco. She reports that she does not drink alcohol or use drugs.    Allergies  Allergen Reactions  .  Methimazole [Methimazole] Anaphylaxis  . Haloperidol Palpitations  . Penicillins Hives    Has patient had a PCN reaction causing immediate rash, facial/tongue/throat swelling, SOB or lightheadedness with hypotension: Yes Has patient had a PCN reaction causing severe rash involving mucus membranes or skin necrosis: No Has patient had a PCN reaction that required hospitalization No Has patient had a PCN reaction occurring within the last 10 years: No If all of the above answers are "NO", then may proceed with Cephalosporin use.     Family History  Problem Relation Age of Onset  . Hypertension Mother   . Hypertension Father       Prior to Admission medications   Medication Sig Start Date End Date Taking? Authorizing Provider  acetaminophen (TYLENOL) 500 MG tablet Take 1 tablet (500 mg total) by mouth every 8 (eight) hours as needed for mild pain, fever or headache. 12/09/16  Yes Rai, Ripudeep K, MD  aspirin 325 MG tablet Take 325 mg by mouth at bedtime.    Yes [provider]  cholecalciferol (VITAMIN D) 1000 units tablet Take 5,000 Units by mouth daily.   Yes [provider]  Cyanocobalamin (B-12) 2500 MCG TABS Take 2,500 mcg by mouth daily.   Yes [provider]  donepezil (ARICEPT) 10 MG tablet Take 10 mg by mouth at bedtime.    Yes [provider]  levothyroxine (SYNTHROID, LEVOTHROID) 75 MCG tablet Take  1 tablet (75 mcg total) by mouth daily. 04/26/16  Yes Reather Littler, MD  OLANZapine (ZYPREXA) 2.5 MG tablet Take 1 tablet daily at midday or early afternoon for control of agitation. 01/08/17  Yes [provider]  cephALEXin (KEFLEX) 500 MG capsule Take 1 capsule (500 mg total) by mouth 2 (two) times daily. X 1 week Patient not taking: Reported on 01/10/2017 12/09/16   Rai, Delene Ruffini, MD  chlordiazePOXIDE (LIBRIUM) 5 MG capsule TAKE 1-2 CAPSULES TWICE DAILY AS NEEDED FOR ANXIETY 10/21/16   [provider]  LORazepam (ATIVAN) 0.5 MG tablet TAKE 1 TABLET BY MOUTH 3 TIMES A DAY AS NEEDED FOR ANXIETY 12/28/16   [provider]    Physical Exam: BP (!) 152/74   Pulse (!) 106   Temp (!) 103.3 F (39.6 C) (Rectal)   Resp (!) 31   Wt 49.9 kg (110 lb)   SpO2 92%   BMI 23.80 kg/m   GENERAL :   Alert and cooperative, and appears to be in no acute distress. HEAD:           normocephalic. EYES:            PERRL, EOMI.   EARS:           hearing grossly intact.   NECK:          supple CARDIAC:    Normal S1 and S2. No gallop. No murmurs.  Vascular:     no peripheral edema.  LUNGS:       Mild crackles in right lung with mild wheezing  ABDOMEN: Positive bowel sounds. Soft, nondistended, nontender. No guarding or rebound.      MSK:           No joint erythema or tenderness. Normal muscular development. EXT           : No significant deformity or joint abnormality. Neuro        : Alert, oriented # 0  SKIN:            No rash. No lesions.           Labs on Admission:  Reviewed.   Radiological Exams on Admission: Dg Chest 2 View  Result Date: 01/10/2017 CLINICAL DATA:  Fever, cough, dementia EXAM: CHEST  2 VIEW COMPARISON:  Chest x-ray of 12/05/2016 FINDINGS: There is patchy parenchymal opacity in the right mid lung suspicious for a focus of pneumonia. Otherwise the lungs are clear. No pleural effusion is seen. Mediastinal and hilar contours are  unremarkable. The heart is within normal limits in size. No bony abnormality is seen. IMPRESSION: Patchy opacity in the right mid lung suspicious for a small focus of pneumonia. Consider followup chest x-ray. Electronically Signed   By: Dwyane DeePaul  Barry M.D.   On: 01/10/2017 17:15     Assessment/Plan  CAP:  Started on vanc/cefepime  Sputum cx, legionealla and strep pneumon Ag sent.  Blood cx sent Admit for obs overnight with possible dc home tomorrow   Dementia: cont home meds  Ativan prn anxiety   Input & Output: NA Lines & Tubes: PIV DVT prophylaxis: [place] enoxaparin  GI prophylaxis: NA Consultants: NA Code Status: Full Family Communication: at bedside   Disposition Plan: TBD    Eston EstersAhmad Ruford Dudzinski M.D [facility]

## 2017-01-10 NOTE — ED Triage Notes (Signed)
Pt comes from doctor's office via EMS with complaints of a fever and cough. PCP sent her here for possible pneumonia.  Pt was just released from rehab facility this morning.  Hx of dementia which is baseline. Vitals 176/92 BP, 110 HR, 30 RR, 95% Spo2 RA, 101.1 temp.

## 2017-01-10 NOTE — ED Notes (Signed)
Phone report given to [employer], Charity fundraiserN.   Geraldine ContrasDee is transporting patient to floor.

## 2017-01-10 NOTE — ED Provider Notes (Signed)
  Face-to-face evaluation   History: Patient with gradually worsening symptoms of fever and cough since yesterday, following discharge, from rehab this morning.  She is at her baseline, mental status, per family members with her at this time.  Physical exam: Alert elderly female.  She states she is comfortable.  No respiratory distress.  Mild tachypnea.  Medical screening examination/treatment/procedure(s) were conducted as a shared visit with non-physician practitioner(s) and myself.  I personally evaluated the patient during the encounter    Mancel BaleWentz, Djon Tith, MD 01/12/17 [phone]

## 2017-01-11 DIAGNOSIS — G20 Parkinson's disease: Secondary | ICD-10-CM | POA: Diagnosis present

## 2017-01-11 DIAGNOSIS — E872 Acidosis, unspecified: Secondary | ICD-10-CM

## 2017-01-11 DIAGNOSIS — F0390 Unspecified dementia without behavioral disturbance: Secondary | ICD-10-CM | POA: Diagnosis present

## 2017-01-11 DIAGNOSIS — J13 Pneumonia due to Streptococcus pneumoniae: Secondary | ICD-10-CM | POA: Diagnosis present

## 2017-01-11 DIAGNOSIS — Z8249 Family history of ischemic heart disease and other diseases of the circulatory system: Secondary | ICD-10-CM | POA: Diagnosis not present

## 2017-01-11 DIAGNOSIS — R05 Cough: Secondary | ICD-10-CM | POA: Diagnosis present

## 2017-01-11 DIAGNOSIS — F0150 Vascular dementia without behavioral disturbance: Secondary | ICD-10-CM

## 2017-01-11 DIAGNOSIS — E079 Disorder of thyroid, unspecified: Secondary | ICD-10-CM | POA: Diagnosis present

## 2017-01-11 DIAGNOSIS — Z880 Allergy status to penicillin: Secondary | ICD-10-CM | POA: Diagnosis not present

## 2017-01-11 DIAGNOSIS — J189 Pneumonia, unspecified organism: Secondary | ICD-10-CM

## 2017-01-11 DIAGNOSIS — E861 Hypovolemia: Secondary | ICD-10-CM | POA: Diagnosis present

## 2017-01-11 DIAGNOSIS — Z79899 Other long term (current) drug therapy: Secondary | ICD-10-CM | POA: Diagnosis not present

## 2017-01-11 DIAGNOSIS — Z8673 Personal history of transient ischemic attack (TIA), and cerebral infarction without residual deficits: Secondary | ICD-10-CM | POA: Diagnosis not present

## 2017-01-11 DIAGNOSIS — Y95 Nosocomial condition: Secondary | ICD-10-CM | POA: Diagnosis present

## 2017-01-11 DIAGNOSIS — E871 Hypo-osmolality and hyponatremia: Secondary | ICD-10-CM | POA: Diagnosis present

## 2017-01-11 DIAGNOSIS — Z7982 Long term (current) use of aspirin: Secondary | ICD-10-CM | POA: Diagnosis not present

## 2017-01-11 DIAGNOSIS — Z888 Allergy status to other drugs, medicaments and biological substances status: Secondary | ICD-10-CM | POA: Diagnosis not present

## 2017-01-11 LAB — BLOOD CULTURE ID PANEL (REFLEXED)
Acinetobacter baumannii: NOT DETECTED
CANDIDA GLABRATA: NOT DETECTED
CANDIDA KRUSEI: NOT DETECTED
CANDIDA PARAPSILOSIS: NOT DETECTED
Candida albicans: NOT DETECTED
Candida tropicalis: NOT DETECTED
ENTEROBACTER CLOACAE COMPLEX: NOT DETECTED
ENTEROCOCCUS SPECIES: NOT DETECTED
ESCHERICHIA COLI: NOT DETECTED
Enterobacteriaceae species: NOT DETECTED
Haemophilus influenzae: NOT DETECTED
KLEBSIELLA OXYTOCA: NOT DETECTED
Klebsiella pneumoniae: NOT DETECTED
LISTERIA MONOCYTOGENES: NOT DETECTED
Methicillin resistance: DETECTED — AB
Neisseria meningitidis: NOT DETECTED
PROTEUS SPECIES: NOT DETECTED
PSEUDOMONAS AERUGINOSA: NOT DETECTED
SERRATIA MARCESCENS: NOT DETECTED
STAPHYLOCOCCUS AUREUS BCID: NOT DETECTED
STREPTOCOCCUS AGALACTIAE: NOT DETECTED
STREPTOCOCCUS PNEUMONIAE: NOT DETECTED
Staphylococcus species: DETECTED — AB
Streptococcus pyogenes: NOT DETECTED
Streptococcus species: NOT DETECTED

## 2017-01-11 LAB — STREP PNEUMONIAE URINARY ANTIGEN: Strep Pneumo Urinary Antigen: POSITIVE — AB

## 2017-01-11 MED ORDER — ENSURE ENLIVE PO LIQD
237.0000 mL | Freq: Two times a day (BID) | ORAL | Status: DC
  Administered 2017-01-12: 237 mL via ORAL

## 2017-01-11 MED ORDER — SODIUM CHLORIDE 0.9 % IV SOLN
INTRAVENOUS | Status: AC
  Administered 2017-01-11: 12:00:00 via INTRAVENOUS

## 2017-01-11 MED ORDER — CHLORHEXIDINE GLUCONATE 0.12 % MT SOLN
15.0000 mL | Freq: Two times a day (BID) | OROMUCOSAL | Status: DC
  Administered 2017-01-11 – 2017-01-12 (×2): 15 mL via OROMUCOSAL
  Filled 2017-01-11 (×2): qty 15

## 2017-01-11 MED ORDER — ORAL CARE MOUTH RINSE
15.0000 mL | Freq: Two times a day (BID) | OROMUCOSAL | Status: DC
  Administered 2017-01-11: 15 mL via OROMUCOSAL

## 2017-01-11 NOTE — Care Management Note (Signed)
Case Management Note  Patient Details  Name: Brittney Yang MRN: [id number] Date of Birth: September 02, 1927  Subjective/Objective:    pna                Action/Plan: lives at home with adult children Date:  January 11, 2017 Chart reviewed for concurrent status and case management needs. Will continue to follow patient progress. Discharge Planning: following for needs Expected discharge date: [id number] Marcelle SmilingRhonda Brandilee Pies, BSN, [place] Pass[place]3, [place]   [phone]  Expected Discharge Date:                  Expected Discharge Plan:  Home/Self Care  In-House Referral:     Discharge planning Services  CM Consult  Post Acute Care Choice:    Choice offered to:     DME Arranged:    DME Agency:     HH Arranged:    HH Agency:     Status of Service:  In process, will continue to follow  If discussed at Long Length of Stay Meetings, dates discussed:    Additional Comments:  Golda AcreDavis, Duell Holdren Lynn, RN 01/11/2017, 8:09 AM

## 2017-01-11 NOTE — Progress Notes (Signed)
PHARMACY - PHYSICIAN COMMUNICATION CRITICAL VALUE ALERT - BLOOD CULTURE IDENTIFICATION (BCID)  Results for orders placed or performed during the hospital encounter of 01/10/17  Blood Culture ID Panel (Reflexed) (Collected: 01/10/2017  4:26 PM)  Result Value Ref Range   Enterococcus species NOT DETECTED NOT DETECTED   Listeria monocytogenes NOT DETECTED NOT DETECTED   Staphylococcus species DETECTED (A) NOT DETECTED   Staphylococcus aureus NOT DETECTED NOT DETECTED   Methicillin resistance DETECTED (A) NOT DETECTED   Streptococcus species NOT DETECTED NOT DETECTED   Streptococcus agalactiae NOT DETECTED NOT DETECTED   Streptococcus pneumoniae NOT DETECTED NOT DETECTED   Streptococcus pyogenes NOT DETECTED NOT DETECTED   Acinetobacter baumannii NOT DETECTED NOT DETECTED   Enterobacteriaceae species NOT DETECTED NOT DETECTED   Enterobacter cloacae complex NOT DETECTED NOT DETECTED   Escherichia coli NOT DETECTED NOT DETECTED   Klebsiella oxytoca NOT DETECTED NOT DETECTED   Klebsiella pneumoniae NOT DETECTED NOT DETECTED   Proteus species NOT DETECTED NOT DETECTED   Serratia marcescens NOT DETECTED NOT DETECTED   Haemophilus influenzae NOT DETECTED NOT DETECTED   Neisseria meningitidis NOT DETECTED NOT DETECTED   Pseudomonas aeruginosa NOT DETECTED NOT DETECTED   Candida albicans NOT DETECTED NOT DETECTED   Candida glabrata NOT DETECTED NOT DETECTED   Candida krusei NOT DETECTED NOT DETECTED   Candida parapsilosis NOT DETECTED NOT DETECTED   Candida tropicalis NOT DETECTED NOT DETECTED    Name of physician (or Provider) Contacted: David StallFeliz Ortiz  Changes to prescribed antibiotics required: none at this time, on vanc/cefepime; too early to definitively narrow based on BCID results  Raymie Trani A 01/11/2017  3:17 PM

## 2017-01-11 NOTE — Progress Notes (Signed)
Initial Nutrition Assessment  DOCUMENTATION CODES:   Not applicable  INTERVENTION:    Downgrade to Dysphagia 2, thin liquid diet   Ensure Enlive po BID, each supplement provides 350 kcal and 20 grams of protein  NUTRITION DIAGNOSIS:   Predicted suboptimal nutrient intake related to dysphagia, lethargy/confusion as evidenced by  (chart review, previous progress notes)  GOAL:   Patient will meet greater than or equal to 90% of their needs  MONITOR:   PO intake, Supplement acceptance, Labs, Weight trends, Skin, I & O's  REASON FOR ASSESSMENT:   Malnutrition Screening Tool  ASSESSMENT:   83 y.o. Female past medical history of advanced dementia, with a recent admission for anterior placenta rehabilitation 30 days prior to admission his come back to the hospital for crackles and wheezing at the skilled nursing facility was found to have pneumonia.  Pt familiar to this RD from [facility]. Spoke with her daughter in 11/2016. Daughter reported pt typically has a good appetite. Pt has hx of chewing and swallowing difficulty. S/p bedside swallow evaluation 12/06/16.  SLP recommended Dys 3-thin liquid diet at that time, however, daughter requested diet downgrade. Medications reviewed and include Vitamin D, Vitamin B-12 and ABX. Labs reviewed. Na 133 (L). CBG 124.  Unable to complete Nutrition-Focused physical exam at this time.   Diet Order:  Diet regular Room service appropriate? Yes; Fluid consistency: Thin  Skin:  Reviewed, no issues  Last BM:  N/A  Height:   Ht Readings from Last 1 Encounters:  01/10/17 4\' 9"  (1.448 m)   Weight:   Wt Readings from Last 1 Encounters:  01/10/17 129 lb 13.6 oz (58.9 kg)   Ideal Body Weight:  43.1 kg  BMI:  Body mass index is 28.1 kg/m.  Estimated Nutritional Needs:   Kcal:  1400-1600  Protein:  65-75 gm  Fluid:  >/= 1.5 L  EDUCATION NEEDS:   No education needs identified at this time  Maureen ChattersKatie  Guinevere Stephenson, RD, LDN Pager #: [phone] After-Hours Pager #: [phone]

## 2017-01-11 NOTE — Progress Notes (Signed)
[facility] PROGRESS NOTE    Progress Note  Brittney AmassVirginia P Yang  ZOX:[id number] DOB: 12/12/1927 DOA: 01/10/2017 PCP: Barbie BannerWilson, Fred H, MD     Brief Narrative:   Brittney AmassVirginia P Yang is an 83 y.o. female past medical history of advanced dementia, with a recent admission for anterior placenta rehabilitation 30 days prior to admission his come back to the hospital for crackles and wheezing at the skilled nursing facility was found to have pneumonia.  Assessment/Plan:   Fever/Leukocytosis due toHCAP (healthcare-associated pneumonia) Continue empiric antibiotics She has defervesced overnight. She is tolerating her diet without any difficulties. Cultures have been negative till date, serologies are pending. Next line  Dementia without behavioral disturbance Continue Aricept. Haldol IV when necessary for agitation, 12-lead EKG showed a QTC of 440.  Mild Hyponatremia: Likely due to hypovolemia start IV fluids.  Normal anion gap Metabolic acidosis Unclear etiology, the family denies any diarrhea. We'll start her on IV fluid hydration and recheck a basic metabolic panel in the morning. She is on no medications to calm metabolic acidosis.  DVT prophylaxis: lovenox Family Communication:daughter Disposition Plan/Barrier to D/C: [facility] home in am Code Status:     Code Status Orders        Start     Ordered   01/10/17 1912  Full code  Continuous     01/10/17 1912    Code Status History    Date Active Date Inactive Code Status Order ID Comments User Context   12/06/2016  1:13 AM 12/09/2016  8:54 PM Full Code [id number]  Kathlen ModyAkula, Vijaya, MD Inpatient   05/02/2014  5:22 PM 05/03/2014  4:17 PM Full Code [id number]  Clydia LlanoElmahi, Mutaz, MD Inpatient        IV Access:    Peripheral IV   Procedures and diagnostic studies:   Dg Chest 2 View  Result Date: 01/10/2017 CLINICAL DATA:  Fever, cough, dementia EXAM: CHEST  2 VIEW COMPARISON:  Chest x-ray of 12/05/2016 FINDINGS: There is  patchy parenchymal opacity in the right mid lung suspicious for a focus of pneumonia. Otherwise the lungs are clear. No pleural effusion is seen. Mediastinal and hilar contours are unremarkable. The heart is within normal limits in size. No bony abnormality is seen. IMPRESSION: Patchy opacity in the right mid lung suspicious for a small focus of pneumonia. Consider followup chest x-ray. Electronically Signed   By: Dwyane DeePaul  Barry M.D.   On: 01/10/2017 17:15     Medical Consultants:    None.  Anti-Infectives:   IV vancomycin and cefepime.  Subjective:    Brittney AmassVirginia P Debow patient is nonverbal with her eyes closed.  Objective:    Vitals:   01/10/17 1930 01/10/17 2003 01/10/17 2050 01/11/17 0511  BP: (!) 128/59 133/61 (!) 154/67 (!) 125/56  Pulse: 93 89 97 70  Resp: (!) 24 (!) 26  18  Temp:   98.7 F (37.1 C) 97.9 F (36.6 C)  TempSrc:   Axillary Oral  SpO2: 94% 97% 96% 97%  Weight:   58.9 kg (129 lb 13.6 oz)   Height:   4\' 9"  (1.448 m)    No intake or output data in the 24 hours ending 01/11/17 1147 Filed Weights   01/10/17 1628 01/10/17 2050  Weight: 49.9 kg (110 lb) 58.9 kg (129 lb 13.6 oz)    Exam: General exam: In no acute distress. Respiratory system: Good air movement clear to auscultation. cardiovascular system:  regular rate and rhythm with positive S1-S2 Gastrointestinal system:  abdomen is soft  nontender nondistended. Central nervous system: moving all 4 extremities without any difficulties. Patient did not want to cooperate with neurological exam.  Extremitiesno lower extremity edema skin: No rashes.  Psychiatry:  judgment and insight appear normal.   Data Reviewed:    Labs: Basic Metabolic Panel:  Recent Labs Lab 01/10/17 1717  NA 133*  K 4.4  CL 105  CO2 19*  GLUCOSE 124*  BUN 20  CREATININE 0.95  CALCIUM 7.2*   GFR Estimated Creatinine Clearance: 31.3 mL/min (by C-G formula based on SCr of 0.95 mg/dL). Liver Function Tests:  Recent  Labs Lab 01/10/17 1717  AST 26  ALT 9*  ALKPHOS 40  BILITOT 1.0  PROT 6.0*  ALBUMIN 2.9*   No results for input(s): LIPASE, AMYLASE in the last 168 hours. No results for input(s): AMMONIA in the last 168 hours. Coagulation profile No results for input(s): INR, PROTIME in the last 168 hours.  CBC:  Recent Labs Lab 01/10/17 1624  WBC 12.7*  NEUTROABS 10.3*  HGB 12.6  HCT 35.6*  MCV 89.7  PLT 255   Cardiac Enzymes: No results for input(s): CKTOTAL, CKMB, CKMBINDEX, TROPONINI in the last 168 hours. BNP (last 3 results) No results for input(s): PROBNP in the last 8760 hours. CBG: No results for input(s): GLUCAP in the last 168 hours. D-Dimer: No results for input(s): DDIMER in the last 72 hours. Hgb A1c: No results for input(s): HGBA1C in the last 72 hours. Lipid Profile: No results for input(s): CHOL, HDL, LDLCALC, TRIG, CHOLHDL, LDLDIRECT in the last 72 hours. Thyroid function studies: No results for input(s): TSH, T4TOTAL, T3FREE, THYROIDAB in the last 72 hours.  Invalid input(s): FREET3 Anemia work up: No results for input(s): VITAMINB12, FOLATE, FERRITIN, TIBC, IRON, RETICCTPCT in the last 72 hours. Sepsis Labs:  Recent Labs Lab 01/10/17 1624 01/10/17 1632 01/10/17 1946  WBC 12.7*  --   --   LATICACIDVEN  --  1.33 1.22   Microbiology No results found for this or any previous visit (from the past 240 hour(s)).   Medications:   . aspirin  325 mg Oral QHS  . chlorhexidine  15 mL Mouth Rinse BID  . cholecalciferol  5,000 Units Oral Daily  . donepezil  10 mg Oral QHS  . enoxaparin (LOVENOX) injection  30 mg Subcutaneous Q24H  . levothyroxine  75 mcg Oral QAC breakfast  . mouth rinse  15 mL Mouth Rinse q12n4p  . OLANZapine  2.5 mg Oral QHS  . vitamin B-12  2,500 mcg Oral Daily   Continuous Infusions: . ceFEPime (MAXIPIME) IV 1 g (01/10/17 1948)  . vancomycin       LOS: 0 days   Marinda ElkFELIZ ORTIZ, Deosha Werden  [facility] Pager [phone]  *Please  refer to [email or web address], password [email or web address] to get updated schedule on who will round on this patient, as hospitalists switch teams weekly. If 7PM-7AM, please contact night-coverage at [email or web address], password [email or web address] for any overnight needs.  01/11/2017, 11:47 AM

## 2017-01-11 NOTE — Evaluation (Signed)
Physical Therapy Evaluation Patient Details Name: Brittney Yang MRN: [id number] DOB: 07-30-1927 Today's Date: 01/11/2017   History of Present Illness  83 yo female admitted with Pna. Hx of advanced dementia, TIA, tremor. Recent d/c from SNF to home then pt readmitted to hospital.     Clinical Impression  On eval, pt required Mod assist for bed mobility and Min assist +2 for safety for ambulation. She walked ~115 feet with 1 HHA. Daughter present during session. Pt presents with general weakness, decreased activity tolerance, and impaired gait and balance. Recommend HHPT and 24 hour supervision/assist. Recommend daily ambulation with nursing assistance in addition to PT following.     Follow Up Recommendations Home health PT;Supervision/Assistance - 24 hour    Equipment Recommendations  None recommended by PT    Recommendations for Other Services       Precautions / Restrictions Precautions Precautions: Fall Precaution Comments: incontinent Restrictions Weight Bearing Restrictions: No      Mobility  Bed Mobility Overal bed mobility: Needs Assistance Bed Mobility: Supine to Sit;Sit to Supine     Supine to sit: Mod assist;HOB elevated Sit to supine: Mod assist;HOB elevated   General bed mobility comments: Assist for trunk and LEs. Multimodal cueing required.   Transfers Overall transfer level: Needs assistance Equipment used: 2 person hand held assist Transfers: Sit to/from Stand Sit to Stand: Min assist;+2 physical assistance;+2 safety/equipment         General transfer comment: Assist to rise, stabilize, control descent. Multimodal cueing required. 2 HHA.   Ambulation/Gait Ambulation/Gait assistance: Min assist;+2 physical assistance;+2 safety/equipment Ambulation Distance (Feet): 115 Feet Assistive device: 1 person hand held assist       General Gait Details: 1 HHA but 2 person for safety. Unsteady. O2 sat 96% on RA, HR 108 bpm. Audible wheezing with  ambulation.  Stairs            Wheelchair Mobility    Modified Rankin (Stroke Patients Only)       Balance Overall balance assessment: Needs assistance         Standing balance support: Bilateral upper extremity supported Standing balance-Leahy Scale: Poor                               Pertinent Vitals/Pain Pain Assessment: No/denies pain    Home Living Family/patient expects to be discharged to:: Private residence Living Arrangements: Children Available Help at Discharge: Family;Available 24 hours/day Type of Home: House Home Access: Stairs to enter Entrance Stairs-Rails: None Entrance Stairs-Number of Steps: 2 Home Layout: One level Home Equipment: None      Prior Function Level of Independence: Needs assistance   Gait / Transfers Assistance Needed: at baseline, pt is ambulatory with handheld assist           Hand Dominance        Extremity/Trunk Assessment   Upper Extremity Assessment Upper Extremity Assessment: Generalized weakness    Lower Extremity Assessment Lower Extremity Assessment: Generalized weakness    Cervical / Trunk Assessment Cervical / Trunk Assessment: Kyphotic  Communication      Cognition Arousal/Alertness: Awake/alert Behavior During Therapy: WFL for tasks assessed/performed Overall Cognitive Status: History of cognitive impairments - at baseline                                        General Comments  Exercises     Assessment/Plan    PT Assessment Patient needs continued PT services  PT Problem List Decreased strength;Decreased mobility;Decreased activity tolerance;Decreased cognition;Decreased balance       PT Treatment Interventions Gait training;Therapeutic activities;Therapeutic exercise;Patient/family education;Functional mobility training    PT Goals (Current goals can be found in the Care Plan section)  Acute Rehab PT Goals Patient Stated Goal: home per  daughter PT Goal Formulation: With family Time For Goal Achievement: 01/25/17 Potential to Achieve Goals: Fair    Frequency Min 3X/week   Barriers to discharge        Co-evaluation               AM-PAC PT "6 Clicks" Daily Activity  Outcome Measure Difficulty turning over in bed (including adjusting bedclothes, sheets and blankets)?: Total Difficulty moving from lying on back to sitting on the side of the bed? : Total Difficulty sitting down on and standing up from a chair with arms (e.g., wheelchair, bedside commode, etc,.)?: Total Help needed moving to and from a bed to chair (including a wheelchair)?: A Lot Help needed walking in hospital room?: A Lot Help needed climbing 3-5 steps with a railing? : A Lot 6 Click Score: 9    End of Session Equipment Utilized During Treatment: Gait belt Activity Tolerance: Patient tolerated treatment well Patient left: in bed;with call bell/phone within reach;with family/visitor present;with bed alarm set   PT Visit Diagnosis: Muscle weakness (generalized) (M62.81);Difficulty in walking, not elsewhere classified (R26.2)    Time: [id number] PT Time Calculation (min) (ACUTE ONLY): 13 min   Charges:   PT Evaluation $PT Eval Low Complexity: 1 Procedure     PT G Codes:          Rebeca AlertJannie Areana Kosanke, MPT Pager: [phone]

## 2017-01-12 DIAGNOSIS — J13 Pneumonia due to Streptococcus pneumoniae: Principal | ICD-10-CM

## 2017-01-12 LAB — CULTURE, BLOOD (ROUTINE X 2): SPECIAL REQUESTS: ADEQUATE

## 2017-01-12 LAB — CREATININE, SERUM: Creatinine, Ser: 0.77 mg/dL (ref 0.44–1.00)

## 2017-01-12 LAB — HIV ANTIBODY (ROUTINE TESTING W REFLEX): HIV Screen 4th Generation wRfx: NONREACTIVE

## 2017-01-12 MED ORDER — AZITHROMYCIN 250 MG PO TABS
500.0000 mg | ORAL_TABLET | Freq: Every day | ORAL | Status: AC
  Administered 2017-01-12: 500 mg via ORAL
  Filled 2017-01-12: qty 2

## 2017-01-12 MED ORDER — CEFPODOXIME PROXETIL 200 MG PO TABS
200.0000 mg | ORAL_TABLET | Freq: Two times a day (BID) | ORAL | Status: DC
  Administered 2017-01-12: 200 mg via ORAL
  Filled 2017-01-12: qty 1

## 2017-01-12 MED ORDER — AZITHROMYCIN 250 MG PO TABS: 250.0000 mg | ORAL_TABLET | Freq: Every day | ORAL | Status: DC

## 2017-01-12 MED ORDER — ENOXAPARIN SODIUM 40 MG/0.4ML ~~LOC~~ SOLN: 40.0000 mg | SUBCUTANEOUS | Status: DC

## 2017-01-12 MED ORDER — CEFPODOXIME PROXETIL 200 MG PO TABS: 200.0000 mg | ORAL_TABLET | Freq: Two times a day (BID) | ORAL | 0 refills | Status: AC

## 2017-01-12 NOTE — Discharge Summary (Signed)
Physician Discharge Summary  Brittney Yang YNW:[id number] DOB: 1928/05/10 DOA: 01/10/2017  PCP: Brittney BannerWilson, Fred H, MD  Admit date: 01/10/2017 Discharge date: 01/14/2017  Admitted From: Home Disposition: Home   Recommendations for Outpatient Follow-up:  1. Follow up with PCP in 1-2 weeks. Discharged on cefpodoxime for streptococcal pneumonia.  2. Please follow up on the following pending results: Blood cultures (1 of 2 growing coagulase-negative staphylococcus presumed contaminant).   Home Health: PT  Equipment/Devices: None new Discharge Condition: Stable CODE STATUS: Full Diet recommendation: Regular  Brief/Interim Summary: Brittney Yang is an 83 y.o. female past medical history of advanced dementia, with a recent admission for anterior placenta rehabilitation 30 days prior to admission his come back to the hospital for crackles and wheezing at the skilled nursing facility was found to have pneumonia. This improved on antibiotics. Streptococcal urinary antigen was positive.   Discharge Diagnoses:  Active Problems:   Dementia without behavioral disturbance   Pneumonia   HCAP (healthcare-associated pneumonia)   Normal anion gap metabolic acidosis  Pneumococcal pneumonia:  - Has tolerated and improved with 3rd generation cephalosporin. Given trial dose of oral vantin prior to discharge (PCN allergy noted), will continue this for 10 days of therapy  1 of 2 blood cultures growing coagulase-negative staphylococcus: Presumed contaminant. Discussed with family.  - Monitor final blood culture results.   Dementia without behavioral disturbance - Continue Aricept.  Mild Hyponatremia: - Likely due to hypovolemia, recheck at follow up  Discharge Instructions Discharge Instructions    Discharge instructions    Complete by:  As directed    You were admitted with pneumonia due to Streptococcus pneumoniae (Pneumococcal pneumonia) which has improved with antibiotics. You are stable for  discharge with the following recommendations:  - Continue taking antibiotics (vantin twice daily for 10 days) for the complete course.  - If you experience fever, worsening cough, weakness, signs of respiratory distress or facial/oral swelling, or nausea/vomiting/diarrhea, seek  medical attention right away.  - Otherwise, follow up with your PCP in the next 1 -2  weeks. - Call Home Care to establish home health services.     Allergies as of 01/12/2017      Reactions   Methimazole [methimazole] Anaphylaxis   Haloperidol Palpitations   Penicillins Hives   Has patient had a PCN reaction causing immediate rash, facial/tongue/throat swelling, SOB or lightheadedness with hypotension: Yes Has patient had a PCN reaction causing severe rash involving mucus membranes or skin necrosis: No Has patient had a PCN reaction that required hospitalization No Has patient had a PCN reaction occurring within the last 10 years: No If all of the above answers are "NO", then may proceed with Cephalosporin use.      Medication List    STOP taking these medications   cephALEXin 500 MG capsule Commonly known as:  KEFLEX     TAKE these medications   acetaminophen 500 MG tablet Commonly known as:  TYLENOL Take 1 tablet (500 mg total) by mouth every 8 (eight) hours as needed for mild pain, fever or headache.   aspirin 325 MG tablet Take 325 mg by mouth at bedtime.   B-12 2500 MCG Tabs Take 2,500 mcg by mouth daily.   cefpodoxime 200 MG tablet Commonly known as:  VANTIN Take 1 tablet (200 mg total) by mouth every 12 (twelve) hours.   chlordiazePOXIDE 5 MG capsule Commonly known as:  LIBRIUM TAKE 1-2 CAPSULES TWICE DAILY AS NEEDED FOR ANXIETY   cholecalciferol 1000 units tablet Commonly known  as:  VITAMIN D Take 5,000 Units by mouth daily.   donepezil 10 MG tablet Commonly known as:  ARICEPT Take 10 mg by mouth at bedtime.   levothyroxine 75 MCG tablet Commonly known as:  SYNTHROID,  LEVOTHROID Take 1 tablet (75 mcg total) by mouth daily.   LORazepam 0.5 MG tablet Commonly known as:  ATIVAN TAKE 1 TABLET BY MOUTH 3 TIMES A DAY AS NEEDED FOR ANXIETY   OLANZapine 2.5 MG tablet Commonly known as:  ZYPREXA Take 1 tablet daily at midday or early afternoon for control of agitation.      Follow-up Information    Brittney Banner, MD Follow up.   Specialty:  Family Medicine Contact information: [address] [place] [address] [phone]          Allergies  Allergen Reactions  . Methimazole [Methimazole] Anaphylaxis  . Haloperidol Palpitations  . Penicillins Hives    Has patient had a PCN reaction causing immediate rash, facial/tongue/throat swelling, SOB or lightheadedness with hypotension: Yes Has patient had a PCN reaction causing severe rash involving mucus membranes or skin necrosis: No Has patient had a PCN reaction that required hospitalization No Has patient had a PCN reaction occurring within the last 10 years: No If all of the above answers are "NO", then may proceed with Cephalosporin use.     Consultations:  None  Procedures/Studies: Dg Chest 2 View  Result Date: 01/10/2017 CLINICAL DATA:  Fever, cough, dementia EXAM: CHEST  2 VIEW COMPARISON:  Chest x-ray of 12/05/2016 FINDINGS: There is patchy parenchymal opacity in the right mid lung suspicious for a focus of pneumonia. Otherwise the lungs are clear. No pleural effusion is seen. Mediastinal and hilar contours are unremarkable. The heart is within normal limits in size. No bony abnormality is seen. IMPRESSION: Patchy opacity in the right mid lung suspicious for a small focus of pneumonia. Consider followup chest x-ray. Electronically Signed   By: Dwyane Dee M.D.   On: 01/10/2017 17:15   Subjective: No fever since admission, family states she's overall improving, mental status returning to baseline (still calling out intermittently and not oriented). Has improving nonproductive cough.  Eating ok. Worked with PT, strength has returned.   Discharge Exam: BP (!) 127/55 (BP Location: Left Arm)   Pulse 76   Temp 98.3 F (36.8 C) (Oral)   Resp 18   Ht 4\' 9"  (1.448 m)   Wt 58.9 kg (129 lb 13.6 oz)   SpO2 97%   BMI 28.10 kg/m   General: Elderly female in no distress Cardiovascular: RRR, S1/S2 +, no rubs, no gallops Respiratory: Right midlung zone with crackles, clear otherwise throughout bilaterally and at bases. Good air exchanged. Nonlabored.  Abdominal: Soft, NT, ND, bowel sounds + Extremities: No edema, no cyanosis  Labs: Basic Metabolic Panel:  Recent Labs Lab 01/10/17 1717 01/12/17 0530  NA 133*  --   K 4.4  --   CL 105  --   CO2 19*  --   GLUCOSE 124*  --   BUN 20  --   CREATININE 0.95 0.77  CALCIUM 7.2*  --    Liver Function Tests:  Recent Labs Lab 01/10/17 1717  AST 26  ALT 9*  ALKPHOS 40  BILITOT 1.0  PROT 6.0*  ALBUMIN 2.9*   CBC:  Recent Labs Lab 01/10/17 1624  WBC 12.7*  NEUTROABS 10.3*  HGB 12.6  HCT 35.6*  MCV 89.7  PLT 255   Urinalysis    Component  Value Date/Time   COLORURINE YELLOW 01/10/2017 1644   APPEARANCEUR CLEAR 01/10/2017 1644   LABSPEC 1.020 01/10/2017 1644   PHURINE 5.0 01/10/2017 1644   GLUCOSEU NEGATIVE 01/10/2017 1644   HGBUR NEGATIVE 01/10/2017 1644   BILIRUBINUR NEGATIVE 01/10/2017 1644   KETONESUR 20 (A) 01/10/2017 1644   PROTEINUR 100 (A) 01/10/2017 1644   UROBILINOGEN 0.2 09/22/2014 1445   NITRITE NEGATIVE 01/10/2017 1644   LEUKOCYTESUR NEGATIVE 01/10/2017 1644    Microbiology Recent Results (from the past 240 hour(s))  Blood Culture (routine x 2)     Status: None (Preliminary result)   Collection Time: 01/10/17  4:25 PM  Result Value Ref Range Status   Specimen Description BLOOD RIGHT HAND  Final   Special Requests IN PEDIATRIC BOTTLE Blood Culture adequate volume  Final   Culture   Final    NO GROWTH 4 DAYS Performed at [facility], [address] [place], [place] [address]     Report Status PENDING  Incomplete  Blood Culture (routine x 2)     Status: Abnormal   Collection Time: 01/10/17  4:26 PM  Result Value Ref Range Status   Specimen Description BLOOD LEFT HAND  Final   Special Requests   Final    BOTTLES DRAWN AEROBIC AND ANAEROBIC Blood Culture adequate volume   Culture  Setup Time   Final    GRAM POSITIVE COCCI IN CLUSTERS AEROBIC BOTTLE ONLY CRITICAL RESULT CALLED TO, READ BACK BY AND VERIFIED WITH: PHARMD D WOFFORD [id number] MLM    Culture (A)  Final    STAPHYLOCOCCUS SPECIES (COAGULASE NEGATIVE) THE SIGNIFICANCE OF ISOLATING THIS ORGANISM FROM A SINGLE SET OF BLOOD CULTURES WHEN MULTIPLE SETS ARE DRAWN IS UNCERTAIN. PLEASE NOTIFY THE MICROBIOLOGY DEPARTMENT WITHIN ONE WEEK IF SPECIATION AND SENSITIVITIES ARE REQUIRED. Performed at [facility], [address], [place], [place] [address]    Report Status 01/12/2017 FINAL  Final  Blood Culture ID Panel (Reflexed)     Status: Abnormal   Collection Time: 01/10/17  4:26 PM  Result Value Ref Range Status   Enterococcus species NOT DETECTED NOT DETECTED Final   Listeria monocytogenes NOT DETECTED NOT DETECTED Final   Staphylococcus species DETECTED (A) NOT DETECTED Final    Comment: Methicillin (oxacillin) resistant coagulase negative staphylococcus. Possible blood culture contaminant (unless isolated from more than one blood culture draw or clinical case suggests pathogenicity). No antibiotic treatment is indicated for blood  culture contaminants. CRITICAL RESULT CALLED TO, READ BACK BY AND VERIFIED WITH: PHARMD D WOFFORD [id number] MLM    Staphylococcus aureus NOT DETECTED NOT DETECTED Final   Methicillin resistance DETECTED (A) NOT DETECTED Final    Comment: CRITICAL RESULT CALLED TO, READ BACK BY AND VERIFIED WITH: PHARMD D WOFFORD [id number] MLM    Streptococcus species NOT DETECTED NOT DETECTED Final   Streptococcus agalactiae NOT DETECTED NOT DETECTED Final   Streptococcus  pneumoniae NOT DETECTED NOT DETECTED Final   Streptococcus pyogenes NOT DETECTED NOT DETECTED Final   Acinetobacter baumannii NOT DETECTED NOT DETECTED Final   Enterobacteriaceae species NOT DETECTED NOT DETECTED Final   Enterobacter cloacae complex NOT DETECTED NOT DETECTED Final   Escherichia coli NOT DETECTED NOT DETECTED Final   Klebsiella oxytoca NOT DETECTED NOT DETECTED Final   Klebsiella pneumoniae NOT DETECTED NOT DETECTED Final   Proteus species NOT DETECTED NOT DETECTED Final   Serratia marcescens NOT DETECTED NOT DETECTED Final   Haemophilus influenzae NOT DETECTED NOT DETECTED Final  Neisseria meningitidis NOT DETECTED NOT DETECTED Final   Pseudomonas aeruginosa NOT DETECTED NOT DETECTED Final   Candida albicans NOT DETECTED NOT DETECTED Final   Candida glabrata NOT DETECTED NOT DETECTED Final   Candida krusei NOT DETECTED NOT DETECTED Final   Candida parapsilosis NOT DETECTED NOT DETECTED Final   Candida tropicalis NOT DETECTED NOT DETECTED Final    Comment: Performed at [facility], [address] [place], [place] [address]    Time coordinating discharge: Approximately 40 minutes  Hazeline Junkeryan Grunz, MD  [facility] 01/14/2017, 5:10 PM Pager [phone]

## 2017-01-12 NOTE — Progress Notes (Signed)
Pt is discharged to home. DC instructions given to daughter and son at bedside. No concerns voiced. Pt left unit in wheelchair pushed by nurse tech. Left in good condition.  Derinda SisVera Zeriah Baysinger,rn.

## 2017-01-14 LAB — LEGIONELLA PNEUMOPHILA SEROGP 1 UR AG: L. pneumophila Serogp 1 Ur Ag: NEGATIVE

## 2017-01-15 LAB — CULTURE, BLOOD (ROUTINE X 2)
Culture: NO GROWTH
Special Requests: ADEQUATE

## 2017-01-24 ENCOUNTER — Other Ambulatory Visit: Payer: Self-pay | Admitting: Endocrinology

## 2017-04-18 DEATH — deceased

## 2018-04-27 IMAGING — CT CT HEAD W/O CM
3 series · 16 of 47 positions shown, 19 images · non-contrast
Comparison: CT and MR 12/05/2016.

CLINICAL DATA: Acute encephalopathy.  History of dementia.

EXAM:
CT HEAD WITHOUT CONTRAST
TECHNIQUE: Contiguous axial images were obtained from the base of the skull
through the vertex without intravenous contrast.

[Series 4: head 5.0 h30s · axial · 0.41mm/px · z∈[-127,+3]mm · 10 of 32 slices shown, 13 images]
[im 3/32  brain]
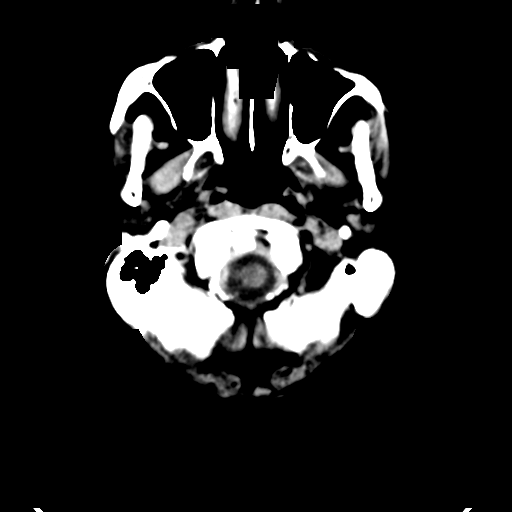
[im 3/32  bone]
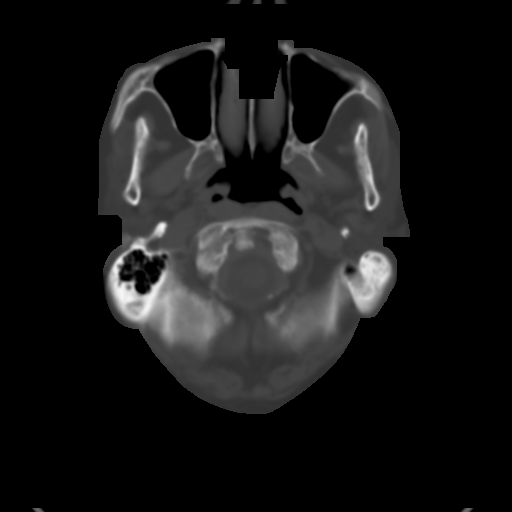
[im 6/32  brain]
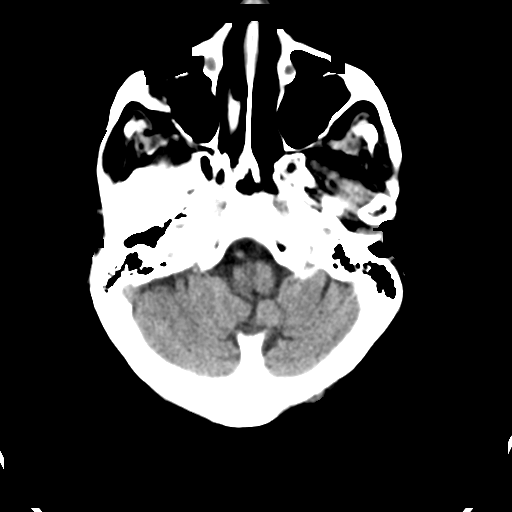
[im 9/32  brain]
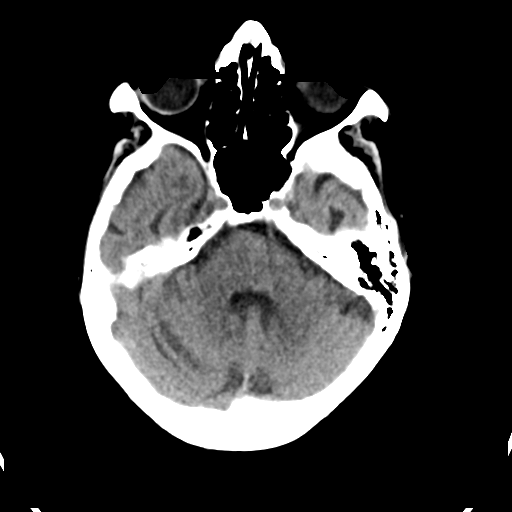
[im 11/32  brain]
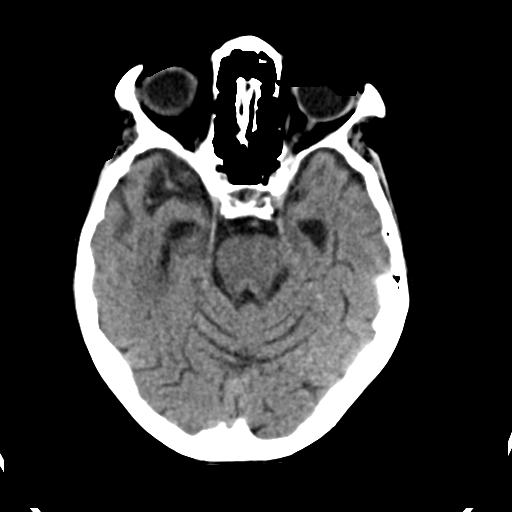
[im 14/32  brain]
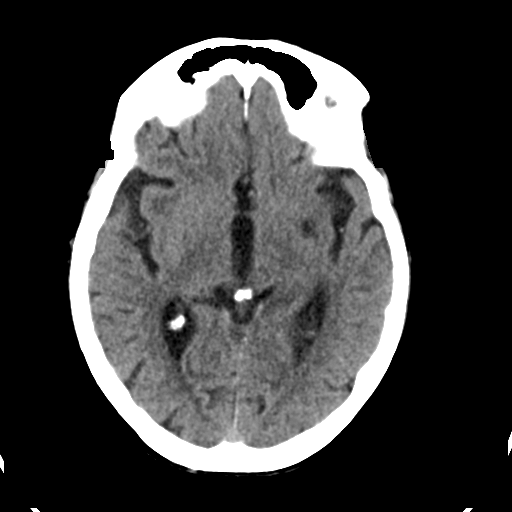
[im 14/32  bone]
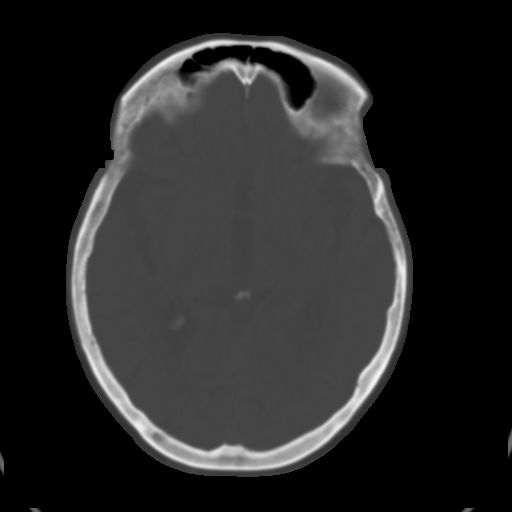
[im 18/32  brain]
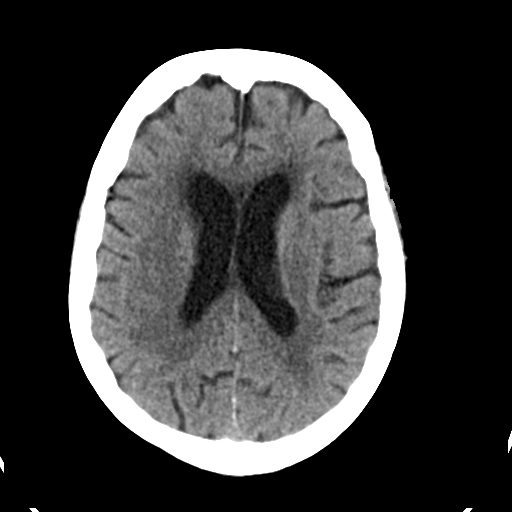
[im 21/32  brain]
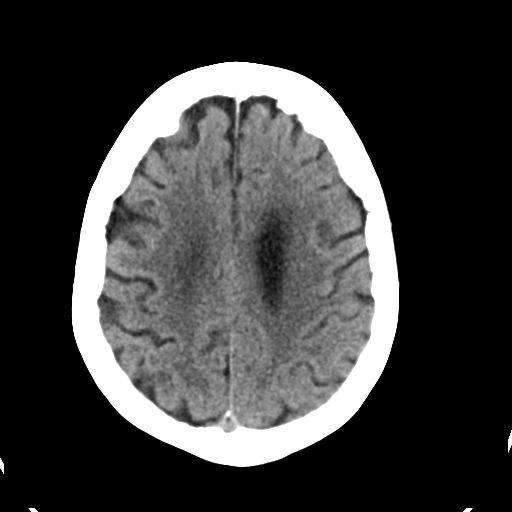
[im 24/32  brain]
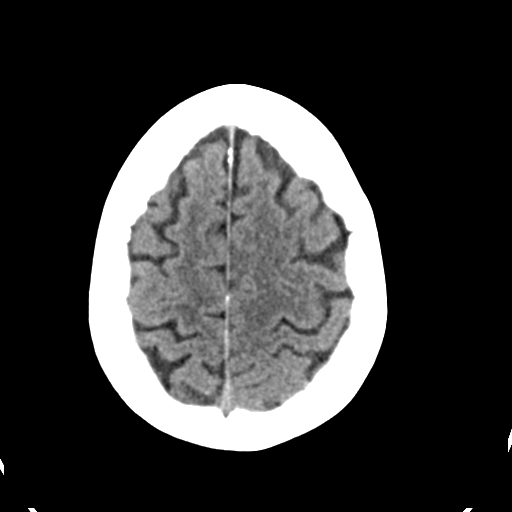
[im 26/32  brain]
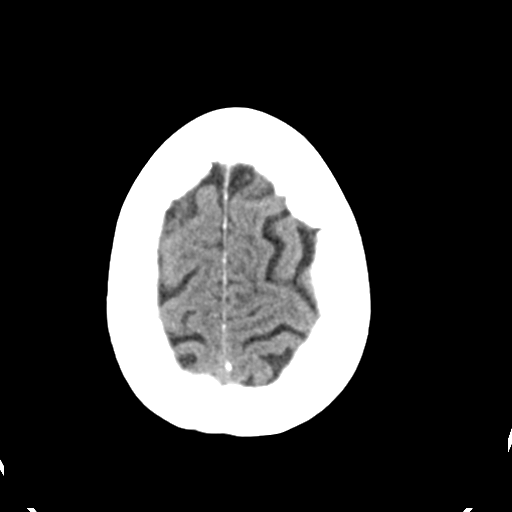
[im 26/32  bone]
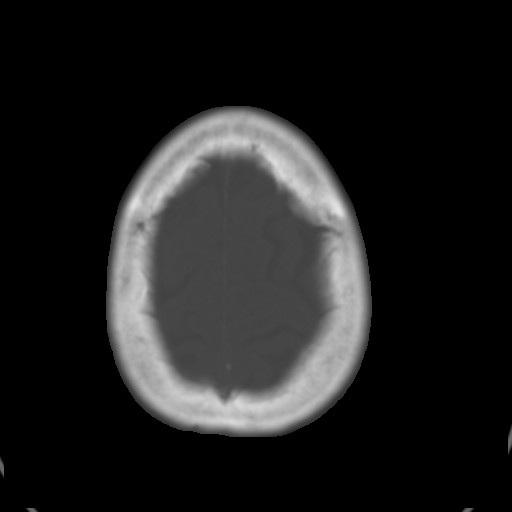
[im 29/32  brain]
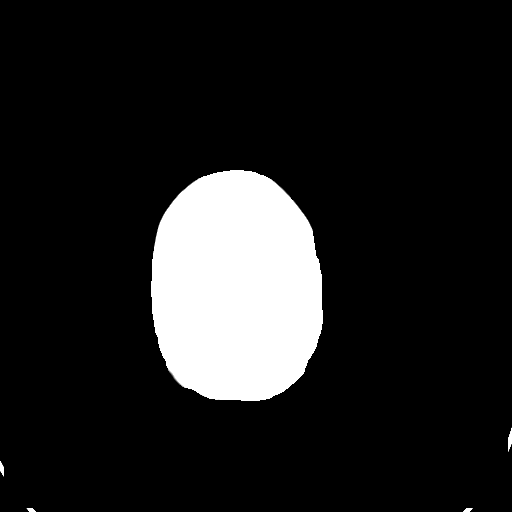

[Series 5: head 3.0 mpr cor · coronal · 0.30mm/px · 3 of 66 slices shown]
[im 22/66  brain]
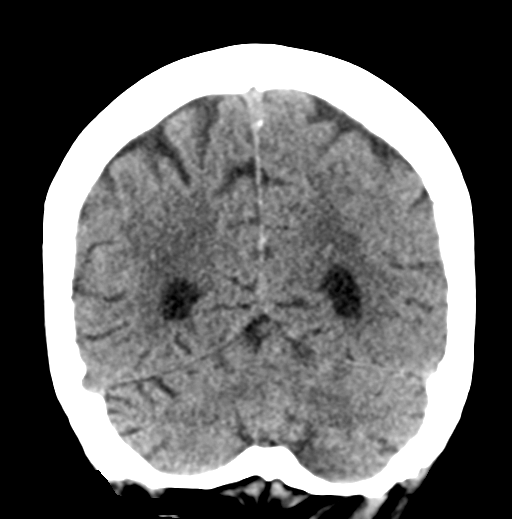
[im 29/66  brain]
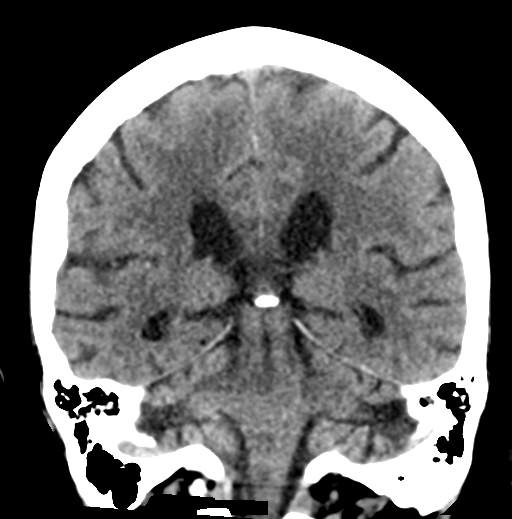
[im 37/66  brain]
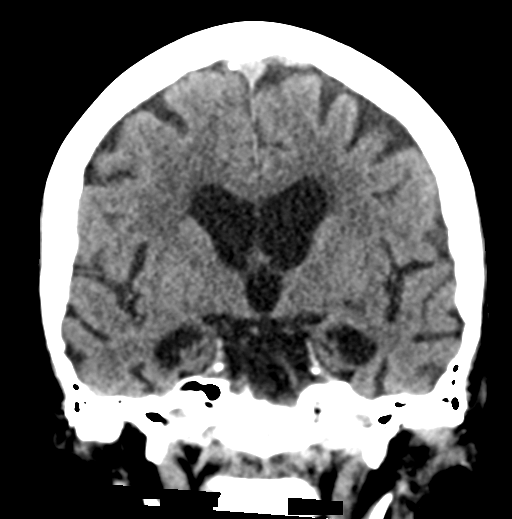

[Series 6: head 3.0 mpr sag · sagittal · 0.32mm/px · 3 of 54 slices shown]
[im 18/54  brain]
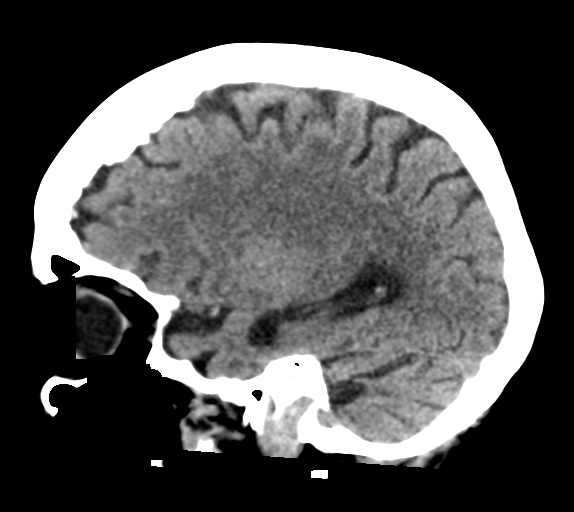
[im 27/54  brain]
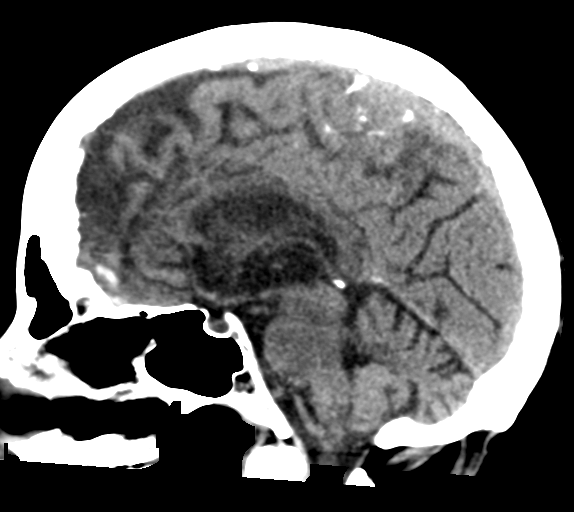
[im 36/54  brain]
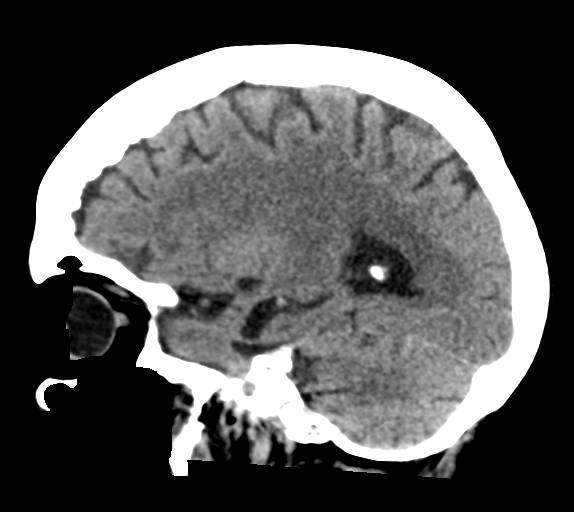

[16 of 47 positions shown; findings below may reference images not displayed]

FINDINGS: Brain: No evidence for acute infarction, hemorrhage, mass lesion,
hydrocephalus, or extra-axial fluid. Advanced atrophy. Moderate
white matter disease.

Vascular: No hyperdense vessel. Calcific atherosclerosis affecting
the skull base internal carotid arteries.

Skull: Normal. Negative for fracture or focal lesion.

Sinuses/Orbits: No acute finding.  BILATERAL cataract surgery.

Other: Chronic sclerosis of the BILATERAL mastoid tips.
IMPRESSION: Chronic changes as described, similar to recent MR and CT.

No acute intracranial findings.
